# Patient Record
Sex: Male | Born: 1965 | Race: White | Hispanic: No | Marital: Married | Smoking: Current some day smoker
Health system: Southern US, Community
[De-identification: ages and names within clinical notes are randomized; demographics above are authoritative.]

---

## 2010-06-09 ENCOUNTER — Ambulatory Visit: Payer: Self-pay | Admitting: Surgery

## 2010-08-23 ENCOUNTER — Ambulatory Visit: Payer: Self-pay | Admitting: Internal Medicine

## 2010-08-25 ENCOUNTER — Ambulatory Visit: Payer: Self-pay | Admitting: Internal Medicine

## 2010-09-23 ENCOUNTER — Ambulatory Visit: Payer: Self-pay | Admitting: Internal Medicine

## 2011-03-08 ENCOUNTER — Observation Stay: Payer: Self-pay | Admitting: Internal Medicine

## 2011-12-10 IMAGING — CT CT NECK-CHEST-ABD-PELV W/ CM
2 series · 10 of 14 positions shown, 11 images · non-contrast
Comparison: none

REASON FOR EXAM: STAGING LYMPHOMA
COMMENTS:

PROCEDURE:     DINA MARIA - DINA MARIA NECK CHEST ABD/PEL W  - September 01, 2010  [DATE]
RESULT:
HISTORY: Lymphoma.

[Series 2: soft tissue · axial · 0.79mm/px · z∈[+374,+639]mm · 4 of 89 slices shown (1 of 2)]
[im 18/89  soft-tissue]
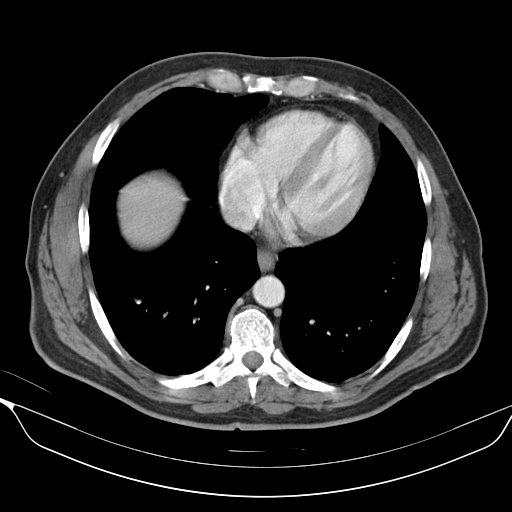
[im 36/89  soft-tissue]
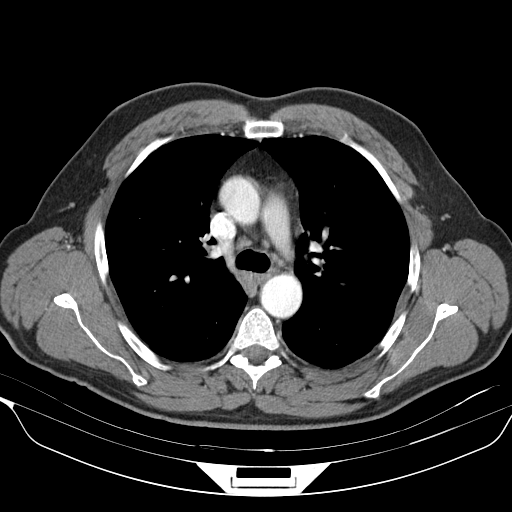
[im 53/89  soft-tissue]
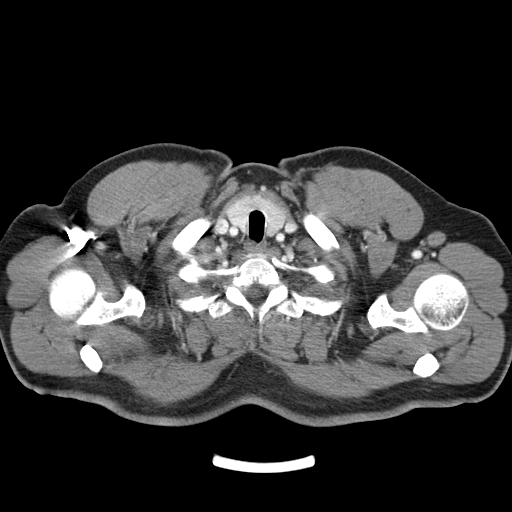
[im 71/89  soft-tissue]
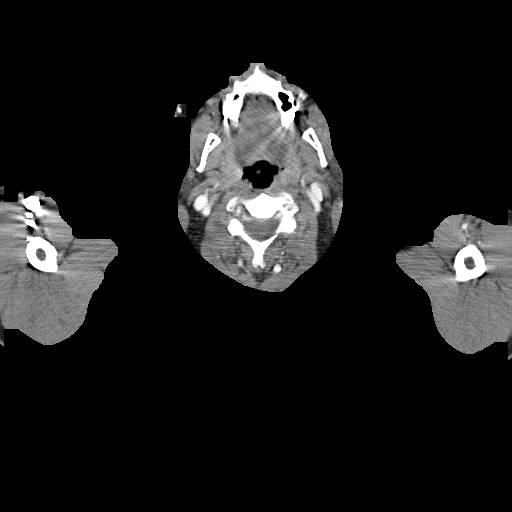

[Series 3: soft tissue · axial · 0.79mm/px · z∈[-34,+336]mm · 6 of 104 slices shown, 7 images (2 of 2)]
[im 15/104  soft-tissue]
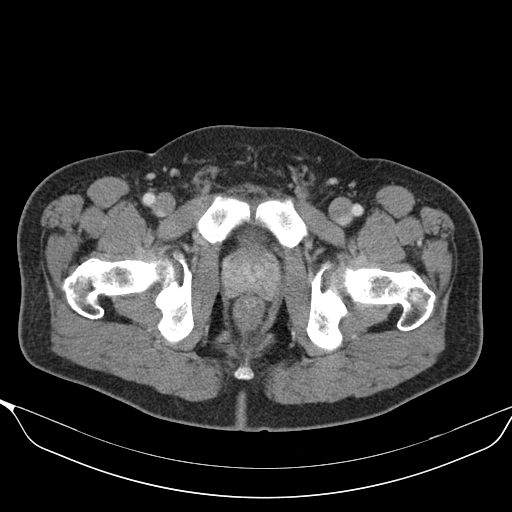
[im 15/104  bone]
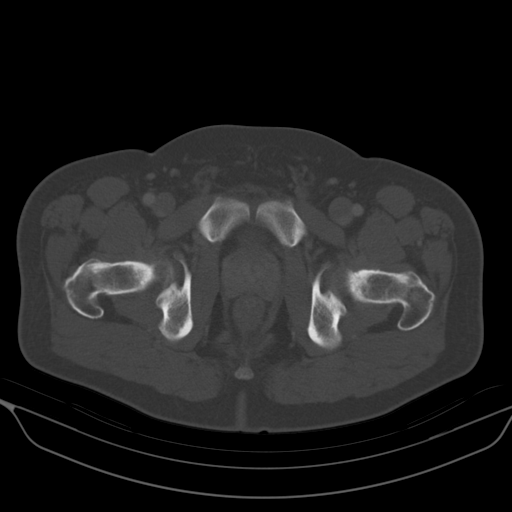
[im 30/104  soft-tissue]
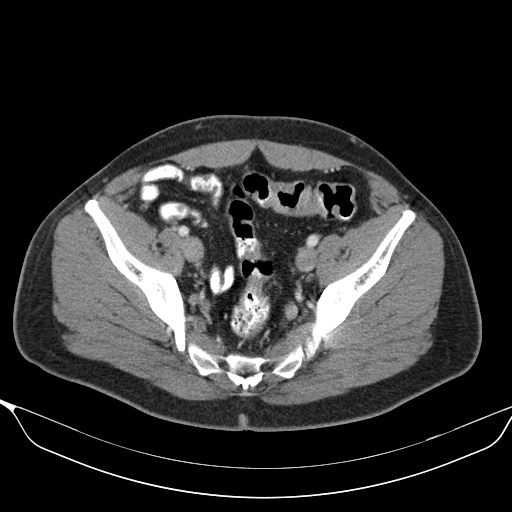
[im 45/104  soft-tissue]
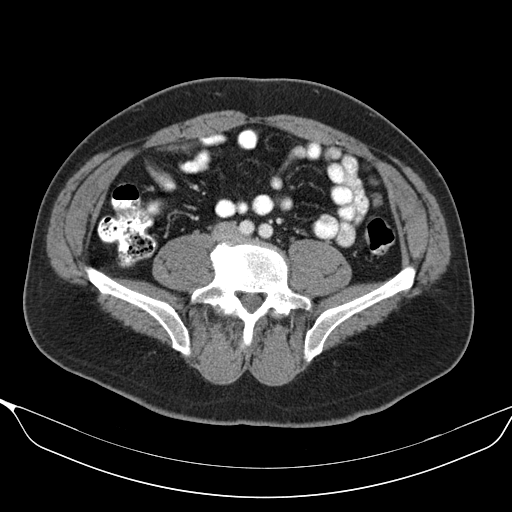
[im 59/104  soft-tissue]
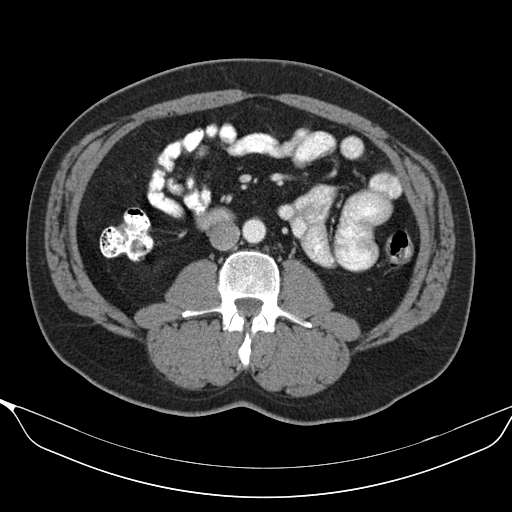
[im 74/104  soft-tissue]
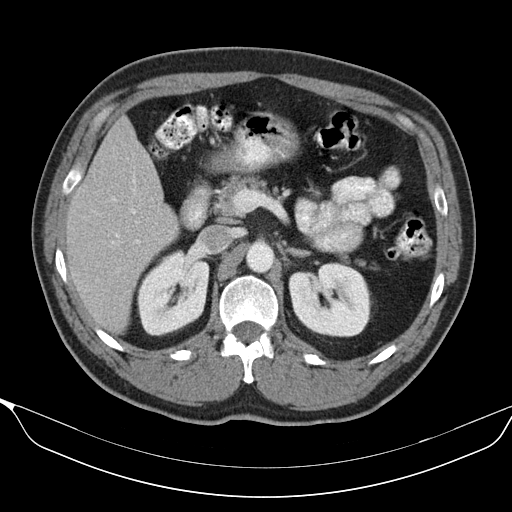
[im 89/104  soft-tissue]
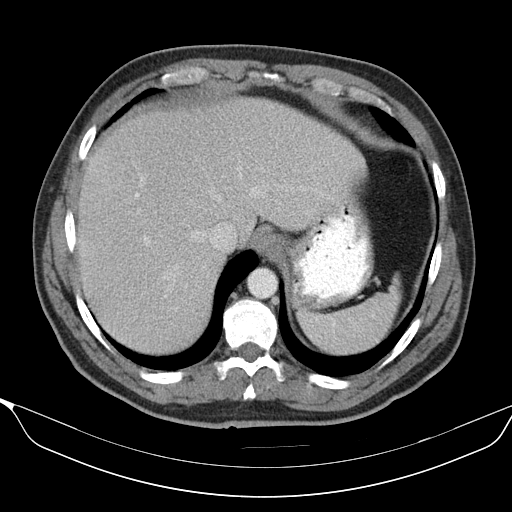

[10 of 14 positions shown; findings below may reference images not displayed]

PROCEDURE AND FINDINGS:  Standard CT of the neck, chest, abdomen and pelvis
is obtained. No prior exams are available for comparison. 115 ml of
Ysovue-ERR utilized for the study.

NECK CT SCAN:  CT of the neck reveals normal tongue base and salivary
glands. Shotty cervical lymph nodes are noted. No pathologic cervical lymph
nodes are noted using CT size criteria. The thyroid is normal. The skull
base is normal. Mild right perihilar infiltrate is present. The larynx is
normal. The thyroid is normal.
CONCLUSION: No significant abnormality noted on CT of neck.

CHEST CT SCAN:  CT of the chest reveals no significant mediastinal
adenopathy. Shotty mediastinal lymph nodes are noted. By CT size criteria,
these are nonspecific. No significant hilar adenopathy noted by CT size
criteria. Mild right perihilar infiltrate is present. The pulmonary arteries
are normal. The large airways are patent. The lungs are clear of
infiltrates.
CONCLUSION: CT of the chest is nonspecific.

ABDOMEN AND PELVIS CT SCAN:  CT of the abdomen and pelvis reveals normal
liver and spleen. The pancreas is normal. The adrenals are normal. The
kidneys are normal. There is no hydronephrosis. No retroperitoneal
pathologic adenopathy using CT criteria. Shotty retroperitoneal lymph nodes
are present. There is no bowel distention. There are no pelvic masses. The
bladder is nondistended. There is no inguinal adenopathy.

CT of abdomen and pelvis is nonspecific. No pathologic adenopathy noted
using CT size criteria.
IMPRESSION: Nonspecific exam. No significant adenopathy noted on this
CT of neck, chest, abdomen and pelvis.

## 2012-06-15 IMAGING — CR DG CHEST 1V
1 series · 1 of 1 positions shown · non-contrast
Comparison: none

REASON FOR EXAM: UPRIGHT; ABD/CP
COMMENTS:   May transport without cardiac monitor

PROCEDURE:     DXR - DXR CHEST 1 VIEWAP OR PA  - March 08, 2011  [DATE]
RESULT:     Comparison: None

[view not recorded]
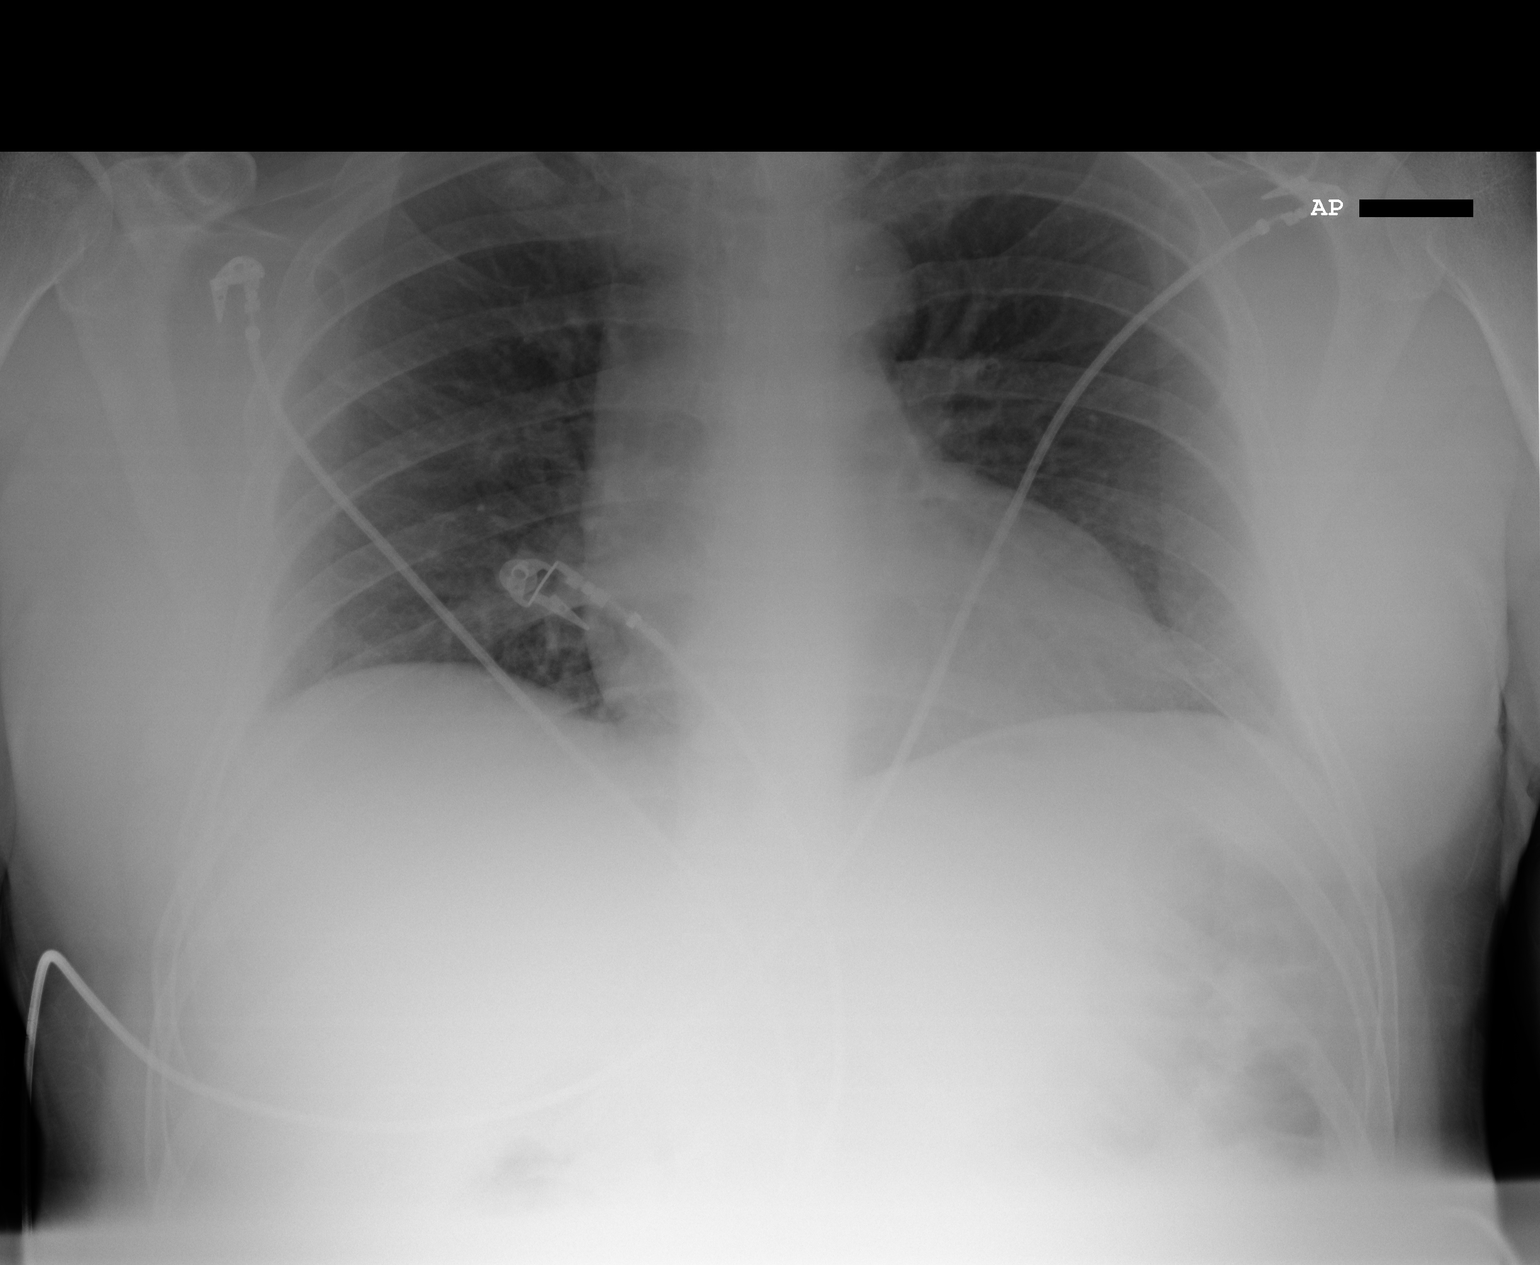

[1 of 1 positions shown; findings below may reference images not displayed]

FINDINGS: Single portable AP chest radiograph is provided.  There is no focal
parenchymal opacity, pleural effusion, or pneumothorax. Normal
cardiomediastinal silhouette. The osseous structures are unremarkable.
IMPRESSION: No acute disease of the chest.

## 2014-12-19 ENCOUNTER — Ambulatory Visit: Payer: Self-pay | Admitting: Emergency Medicine

## 2014-12-19 LAB — DOT URINE DIP
Blood: NEGATIVE
GLUCOSE, UR: NEGATIVE
Protein: NEGATIVE
Specific Gravity: 1.02 (ref 1.000–1.030)

## 2020-07-10 ENCOUNTER — Encounter: Payer: Self-pay | Admitting: Emergency Medicine

## 2020-07-10 ENCOUNTER — Other Ambulatory Visit: Payer: Self-pay

## 2020-07-10 ENCOUNTER — Ambulatory Visit
Admission: EM | Admit: 2020-07-10 | Discharge: 2020-07-10 | Disposition: A | Discharge status: Home / Self Care | Payer: 59 | Attending: Internal Medicine | Admitting: Internal Medicine

## 2020-07-10 DIAGNOSIS — G8929 Other chronic pain: Secondary | ICD-10-CM | POA: Diagnosis present

## 2020-07-10 DIAGNOSIS — M25561 Pain in right knee: Secondary | ICD-10-CM | POA: Insufficient documentation

## 2020-07-10 DIAGNOSIS — I10 Essential (primary) hypertension: Secondary | ICD-10-CM | POA: Diagnosis present

## 2020-07-10 DIAGNOSIS — M25461 Effusion, right knee: Secondary | ICD-10-CM | POA: Insufficient documentation

## 2020-07-10 LAB — COMPREHENSIVE METABOLIC PANEL
ALT: 16 U/L (ref 0–44)
AST: 21 U/L (ref 15–41)
Albumin: 4.2 g/dL (ref 3.5–5.0)
Alkaline Phosphatase: 71 U/L (ref 38–126)
Anion gap: 6 (ref 5–15)
BUN: 19 mg/dL (ref 6–20)
CO2: 23 mmol/L (ref 22–32)
Calcium: 9 mg/dL (ref 8.9–10.3)
Chloride: 107 mmol/L (ref 98–111)
Creatinine, Ser: 1.03 mg/dL (ref 0.61–1.24)
GFR calc Af Amer: >60 mL/min (ref >60)
GFR calc non Af Amer: >60 mL/min (ref >60)
Glucose, Bld: 90 mg/dL (ref 70–99)
Potassium: 4 mmol/L (ref 3.5–5.1)
Sodium: 136 mmol/L (ref 135–145)
Total Bilirubin: 0.6 mg/dL (ref 0.3–1.2)
Total Protein: 7.8 g/dL (ref 6.5–8.1)

## 2020-07-10 MED ORDER — LOSARTAN POTASSIUM 25 MG PO TABS: 25.0000 mg | ORAL_TABLET | Freq: Every day | ORAL | 1 refills | Status: DC

## 2020-07-10 MED ORDER — IBUPROFEN 800 MG PO TABS: 800.0000 mg | ORAL_TABLET | Freq: Three times a day (TID) | ORAL | 0 refills | Status: DC | PRN

## 2020-07-10 NOTE — ED Triage Notes (Signed)
Patient c/o right knee pain after he twisted it 1 1/2 years ago. He states he keeps re-injuring his knee and now has swelling and pain.

## 2020-07-10 NOTE — ED Provider Notes (Addendum)
[facility]    CSN: [id number] Arrival date & time: 07/10/20  1558      History   Chief Complaint Chief Complaint  Patient presents with  . Knee Pain  . Joint Swelling    HPI Randy May is a 54 y.o. male with a history of chronic right knee pain with swelling comes to urgent care for right knee swelling.  Patient is requesting pain medications because he anticipates driving to [place] for work.  Right knee swelling and pain has been a recurrent issue over the past several years.  He has had multiple injuries to the right knee.  He uses knee brace on a regular basis and that seems to help the swelling.   Patient also has concerns about his blood pressure.  He has a family history of high blood pressure and according to the patient his blood pressure checks have all been high over the past several months.  He has not been started on any antihypertensive medication.  No chest pain or chest pressure.  No abdominal pain. HPI  History reviewed. No pertinent past medical history.  There are no problems to display for this patient.   History reviewed. No pertinent surgical history.     Home Medications    Prior to Admission medications   Medication Sig Start Date End Date Taking? Authorizing Provider  ibuprofen (ADVIL) 800 MG tablet Take 1 tablet (800 mg total) by mouth every 8 (eight) hours as needed for moderate pain. 07/10/20   Merrilee Jansky, MD  losartan (COZAAR) 25 MG tablet Take 1 tablet (25 mg total) by mouth daily. 07/10/20   Kasiah Manka, Britta Mccreedy, MD    Family History History reviewed. No pertinent family history.  Social History Social History   Tobacco Use  . Smoking status: Current Some Day Smoker  . Smokeless tobacco: Never Used  Substance Use Topics  . Alcohol use: Yes  . Drug use: Never     Allergies   Patient has no known allergies.   Review of Systems Review of Systems  Constitutional: Negative.   Respiratory: Negative.    Gastrointestinal: Negative.   Genitourinary: Negative.   Musculoskeletal: Positive for arthralgias and joint swelling. Negative for back pain, myalgias, neck pain and neck stiffness.  Neurological: Negative.      Physical Exam Triage Vital Signs ED Triage Vitals  Enc Vitals Group     BP 07/10/20 1629 (!) 181/100     Pulse Rate 07/10/20 1629 100     Resp 07/10/20 1629 18     Temp 07/10/20 1629 98 F (36.7 C)     Temp Source 07/10/20 1629 Oral     SpO2 07/10/20 1629 100 %     Weight 07/10/20 1631 215 lb (97.5 kg)     Height 07/10/20 1631 6\' 1"  (1.854 m)     Head Circumference --      Peak Flow --      Pain Score 07/10/20 1631 9     Pain Loc --      Pain Edu? --      Excl. in GC? --    No data found.  Updated Vital Signs BP (!) 181/100 (BP Location: Right Arm)   Pulse 100   Temp 98 F (36.7 C) (Oral)   Resp 18   Ht 6\' 1"  (1.854 m)   Wt 97.5 kg   SpO2 100%   BMI 28.37 kg/m   Visual Acuity Right Eye Distance:   Left  Eye Distance:   Bilateral Distance:    Right Eye Near:   Left Eye Near:    Bilateral Near:     Physical Exam Vitals and nursing note reviewed.  Cardiovascular:     Rate and Rhythm: Normal rate.  Pulmonary:     Effort: Pulmonary effort is normal.     Breath sounds: Normal breath sounds.  Abdominal:     General: Bowel sounds are normal. There is no distension.     Palpations: Abdomen is soft.     Tenderness: There is no abdominal tenderness.     Hernia: No hernia is present.  Musculoskeletal:        General: Swelling present. No tenderness, deformity or signs of injury. Normal range of motion.     Comments: No bruising on the knee.  Skin:    General: Skin is warm.     Capillary Refill: Capillary refill takes less than 2 seconds.  Neurological:     General: No focal deficit present.     Mental Status: He is alert and oriented to person, place, and time.      UC Treatments / Results  Labs (all labs ordered are listed, but only abnormal  results are displayed) Labs Reviewed  COMPREHENSIVE METABOLIC PANEL    EKG   Radiology No results found.  Procedures Procedures (including critical care time)  Medications Ordered in UC Medications - No data to display  Initial Impression / Assessment and Plan / UC Course  I have reviewed the triage vital signs and the nursing notes.  Pertinent labs & imaging results that were available during my care of the patient were reviewed by me and considered in my medical decision making (see chart for details).     1. Right knee effusion, chronic: Ibuprofen 600 mg q6h as needed for pain Continue the knee brace use  Please make arrangements to follow up with orthopedic surgery.  I advised the patient establish primary care physicians services. He is willing to do so in due course. Return precautions given.  2.  Hypertension likely essential: BMP Losartan 25mg  orally daily Patient is advised to establish with a PCP. Final Clinical Impressions(s) / UC Diagnoses   Final diagnoses:  Effusion of right knee  Chronic pain of right knee  Essential hypertension     Discharge Instructions     Call the Orthopedic practice or go to the orthopedic surgery urgent care    ED Prescriptions    Medication Sig Dispense Auth. Provider   ibuprofen (ADVIL) 800 MG tablet Take 1 tablet (800 mg total) by mouth every 8 (eight) hours as needed for moderate pain. 30 tablet Jamespaul Secrist, , MD   losartan (COZAAR) 25 MG tablet Take 1 tablet (25 mg total) by mouth daily. 30 tablet Carrick Rijos, Britta Mccreedy, MD     I have reviewed the PDMP during this encounter.   Britta Mccreedy, MD 07/11/20 1105    07/13/20, MD 07/11/20 [phone]

## 2020-07-10 NOTE — Discharge Instructions (Addendum)
Call the Orthopedic practice or go to the orthopedic surgery urgent care

## 2021-04-11 ENCOUNTER — Other Ambulatory Visit: Payer: Self-pay

## 2021-04-11 ENCOUNTER — Emergency Department: Payer: PRIVATE HEALTH INSURANCE

## 2021-04-11 ENCOUNTER — Emergency Department
Admission: EM | Admit: 2021-04-11 | Discharge: 2021-04-13 | Disposition: A | Discharge status: Home / Self Care | Payer: Self-pay | Attending: Emergency Medicine | Admitting: Emergency Medicine

## 2021-04-11 DIAGNOSIS — F29 Unspecified psychosis not due to a substance or known physiological condition: Secondary | ICD-10-CM | POA: Insufficient documentation

## 2021-04-11 DIAGNOSIS — F23 Brief psychotic disorder: Secondary | ICD-10-CM | POA: Diagnosis not present

## 2021-04-11 DIAGNOSIS — F3112 Bipolar disorder, current episode manic without psychotic features, moderate: Secondary | ICD-10-CM | POA: Insufficient documentation

## 2021-04-11 DIAGNOSIS — Z20822 Contact with and (suspected) exposure to covid-19: Secondary | ICD-10-CM | POA: Insufficient documentation

## 2021-04-11 DIAGNOSIS — F17200 Nicotine dependence, unspecified, uncomplicated: Secondary | ICD-10-CM | POA: Insufficient documentation

## 2021-04-11 DIAGNOSIS — F3110 Bipolar disorder, current episode manic without psychotic features, unspecified: Secondary | ICD-10-CM

## 2021-04-11 DIAGNOSIS — R45851 Suicidal ideations: Secondary | ICD-10-CM | POA: Insufficient documentation

## 2021-04-11 LAB — CBC
HCT: 37 % — ABNORMAL LOW (ref 39.0–52.0)
Hemoglobin: 13 g/dL (ref 13.0–17.0)
MCH: 33.8 pg (ref 26.0–34.0)
MCHC: 35.1 g/dL (ref 30.0–36.0)
MCV: 96.1 fL (ref 80.0–100.0)
Platelets: 330 10*3/uL (ref 150–400)
RBC: 3.85 MIL/uL — ABNORMAL LOW (ref 4.22–5.81)
RDW: 13 % (ref 11.5–15.5)
WBC: 7.4 10*3/uL (ref 4.0–10.5)
nRBC: 0 % (ref 0.0–0.2)

## 2021-04-11 LAB — URINE DRUG SCREEN, QUALITATIVE (ARMC ONLY)
Amphetamines, Ur Screen: NOT DETECTED
Barbiturates, Ur Screen: NOT DETECTED
Benzodiazepine, Ur Scrn: NOT DETECTED
Cannabinoid 50 Ng, Ur ~~LOC~~: NOT DETECTED
Cocaine Metabolite,Ur ~~LOC~~: NOT DETECTED
MDMA (Ecstasy)Ur Screen: NOT DETECTED
Methadone Scn, Ur: NOT DETECTED
Opiate, Ur Screen: NOT DETECTED
Phencyclidine (PCP) Ur S: NOT DETECTED
Tricyclic, Ur Screen: NOT DETECTED

## 2021-04-11 LAB — COMPREHENSIVE METABOLIC PANEL
ALT: 14 U/L (ref 0–44)
AST: 20 U/L (ref 15–41)
Albumin: 4.2 g/dL (ref 3.5–5.0)
Alkaline Phosphatase: 73 U/L (ref 38–126)
Anion gap: 10 (ref 5–15)
BUN: 22 mg/dL — ABNORMAL HIGH (ref 6–20)
CO2: 20 mmol/L — ABNORMAL LOW (ref 22–32)
Calcium: 9.3 mg/dL (ref 8.9–10.3)
Chloride: 107 mmol/L (ref 98–111)
Creatinine, Ser: 0.95 mg/dL (ref 0.61–1.24)
GFR, Estimated: >60 mL/min (ref >60)
Glucose, Bld: 93 mg/dL (ref 70–99)
Potassium: 4 mmol/L (ref 3.5–5.1)
Sodium: 137 mmol/L (ref 135–145)
Total Bilirubin: 1.1 mg/dL (ref 0.3–1.2)
Total Protein: 7.4 g/dL (ref 6.5–8.1)

## 2021-04-11 LAB — SALICYLATE LEVEL: Salicylate Lvl: <7 mg/dL — ABNORMAL LOW (ref 7.0–30.0)

## 2021-04-11 LAB — RESP PANEL BY RT-PCR (FLU A&B, COVID) ARPGX2
Influenza A by PCR: NEGATIVE
Influenza B by PCR: NEGATIVE
SARS Coronavirus 2 by RT PCR: NEGATIVE

## 2021-04-11 LAB — ETHANOL: Alcohol, Ethyl (B): <10 mg/dL (ref <10)

## 2021-04-11 LAB — ACETAMINOPHEN LEVEL: Acetaminophen (Tylenol), Serum: <10 ug/mL — ABNORMAL LOW (ref 10–30)

## 2021-04-11 MED ORDER — LORAZEPAM 2 MG/ML IJ SOLN
INTRAMUSCULAR | Status: AC
  Filled 2021-04-11: qty 1

## 2021-04-11 MED ORDER — ZIPRASIDONE MESYLATE 20 MG IM SOLR: 20.0000 mg | Freq: Two times a day (BID) | INTRAMUSCULAR | Status: DC | PRN

## 2021-04-11 MED ORDER — LORAZEPAM 2 MG/ML IJ SOLN
2.0000 mg | Freq: Once | INTRAMUSCULAR | Status: AC
  Administered 2021-04-11: 2 mg via INTRAMUSCULAR

## 2021-04-11 MED ORDER — DIPHENHYDRAMINE HCL 50 MG/ML IJ SOLN
50.0000 mg | Freq: Once | INTRAMUSCULAR | Status: AC
  Administered 2021-04-11: 50 mg via INTRAMUSCULAR
  Filled 2021-04-11: qty 1

## 2021-04-11 MED ORDER — HALOPERIDOL LACTATE 5 MG/ML IJ SOLN
5.0000 mg | Freq: Once | INTRAMUSCULAR | Status: AC
  Administered 2021-04-11: 5 mg via INTRAMUSCULAR
  Filled 2021-04-11: qty 1

## 2021-04-11 NOTE — ED Notes (Signed)
Pt to CT
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
## 2021-04-11 NOTE — ED Notes (Signed)
Pt belongings:  Sunglasses 1 gray shirt 1 pair of dark gray sweatpants  1 pair of black socks  1 pair of black sneakers    Pt has 2 belongings bags.

## 2021-04-11 NOTE — ED Notes (Signed)
Standing at doorway demanding that staff call [facility]. States that he is not a [place] Citizen and his name is not [name]. States he wife is "[place]" from [employer]. Pt demanding belongings.

## 2021-04-11 NOTE — ED Notes (Signed)
Pt given dinner tray.

## 2021-04-11 NOTE — ED Provider Notes (Signed)
Patient is becoming more agitated, ordered 2 mg of IM Ativan to help calm him   Jene Every, MD 04/11/21 1447

## 2021-04-11 NOTE — ED Notes (Signed)
Dr.Clapacs at bedside  

## 2021-04-11 NOTE — ED Notes (Signed)
Pt received lunch 

## 2021-04-11 NOTE — BH Assessment (Signed)
Referral information for Psychiatric Hospitalization faxed to;   [facility] [phone]),   . [facility] [phone]- [phone]),  . [facility] ([phone]),  . [facility] [phone], [phone], [phone] or [phone]),   . [place] [phone] or [phone]),  . [facility] [phone]),   . [facility] [phone]- [phone]   . [place] [phone] or [phone]),   . [facility] [phone]),  . [facility] [phone]).

## 2021-04-11 NOTE — BH Assessment (Signed)
Writer was unable to complete TTS consult due to patient receiving medications and he is unable to participate in the interview.

## 2021-04-11 NOTE — ED Notes (Signed)
Pt standing at doorway yelling, stating that he is leaving. Pt asked to sit down on bed so I could administer medications. Sits down and takes IM medications without difficulties.
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
## 2021-04-12 DIAGNOSIS — F3110 Bipolar disorder, current episode manic without psychotic features, unspecified: Secondary | ICD-10-CM

## 2021-04-12 MED ORDER — DROPERIDOL 2.5 MG/ML IJ SOLN
10.0000 mg | Freq: Once | INTRAMUSCULAR | Status: AC
  Administered 2021-04-12: 10 mg via INTRAMUSCULAR

## 2021-04-12 MED ORDER — OLANZAPINE 10 MG PO TABS
10.0000 mg | ORAL_TABLET | Freq: Two times a day (BID) | ORAL | Status: DC
  Administered 2021-04-13: 10 mg via ORAL
  Filled 2021-04-12: qty 1
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
## 2021-04-12 NOTE — ED Provider Notes (Signed)
Emergency Medicine Observation Re-evaluation Note  Randy May is a 55 y.o. male, seen on rounds today.  Pt initially presented to the ED for complaints of Aggressive Behavior Currently, the patient is resting comfortably.  Physical Exam  BP (!) 134/92 (BP Location: Left Arm)   Pulse 89   Temp 98.4 F (36.9 C) (Oral)   Resp 18   Ht 6\' 1"  (1.854 m)   Wt 81.6 kg   SpO2 98%   BMI 23.75 kg/m  Physical Exam Gen: No acute distress  Resp: Normal rise and fall of chest Neuro: Moving all four extremities Psych: Resting currently, calm and cooperative when awake    ED Course / MDM  EKG:   I have reviewed the labs performed to date as well as medications administered while in observation.  Recent changes in the last 24 hours include no acute events overnight.  Plan  Current plan is for inpatient psychiatric treatment. Patient is under full IVC at this time.   Randy May, , DO 04/12/21 [phone]

## 2021-04-12 NOTE — ED Notes (Signed)
Pt up to bathroom.

## 2021-04-12 NOTE — ED Notes (Signed)
Pt given phone. Pt able to recite number to this RN to dial. Making more sense at this time.
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
## 2021-04-12 NOTE — ED Notes (Signed)
Pt out of room asking for a blanket and for his phone once again. This tech and Tax inspector spoke with pt about his IVC paperwork. He also asked "when am I getting out of here? I have to go to work. If you don't show up for work its ok but if I don't show up the place closes." Musician gave pt a phone to call work and explain the situation.
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
## 2021-04-12 NOTE — ED Notes (Signed)
IVC pending placement 
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
## 2021-04-12 NOTE — ED Notes (Signed)
Pt given lunch tray.

## 2021-04-12 NOTE — ED Notes (Signed)
Pt calmer now. Up to ask officer question then goes back to bed.

## 2021-04-12 NOTE — Consult Note (Signed)
[facility] Face-to-Face Psychiatry Consult   Reason for Consult: Consult for 55 year old May with no known past psychiatric history brought to the emergency room with acute psychosis Referring Physician: Fuller Plan Patient Identification: Randy May MRN:  [id number] Principal Diagnosis: Bipolar affective disorder, current episode manic without psychotic symptoms (HCC) Diagnosis:  Principal Problem:   Bipolar affective disorder, current episode manic without psychotic symptoms (HCC) Active Problems:   Acute psychosis (HCC)  Total Time spent with patient: 30 minutes  Subjective:   Randy May is a 55 y.o. male patient admitted with "I'm very frustrated.  I'm missing work and haven't talked to my wife".  55 yo male who was given agitation medication prior to assessment.  He was resting quietly and sat up for the assessment.  He expresses frustration and continues to be manic, improved from admission.  The client stated he was treated "with Lithium in prison" but does not need it and stopped taking it a week ago when he was released.  He was actually in jail and maybe some prison time.  Continues to be tangential at times and upset he was given medications, "That crazy, I'm not crazy."  He lives with his parents and states they bother him frequently, per the notes, he was aggressive towards them.  Continues to meet inpatient criteria for admission.  5/20 admission: Patient seen and chart reviewed.  55 year old May brought in under IVC filed by family.  The IVC paperwork says that the patient has been "very emotional" for the last day and has been agitated and aggressive towards his family at home.  Patient presents as grossly psychotic in a manic fashion.  He has been hyperverbal since coming into the emergency room.  Can hardly sit down for more than a few seconds.  Talks loudly and with pressured speech and is grandiose and has flight of ideas.  Tells me that he is not really Randy May and tells a  long and delusional story about how he is really a Randy May who was sent to replace Randy May after Randy May was killed in a military accident.  Boasts about multiple degrees that he has.  The information I could get out of him that seemed realistic was him telling me that he had not slept for at least for 5 days.  He denies that he has used any drugs whatsoever and we do not have a drug screen yet.  Specifically denies any stimulants.  Says he had 2 beers yesterday but normally does not drink much.  Patient has been slightly physically aggressive putting his hands on people but not swinging or attempting violence.  He appears to be lucid insofar as not delirious and does not appear to be in any acute physical distress.  He is stable on his feet.  Eyesight seems normal.  Patient denies hallucinations.  Denies being on any medication.  Past Psychiatric History: Patient says he was seen by a psychiatrist when he was in jail 2 weeks ago but does not disclose anything about the situation.  Other than that he says he is never seen a mental health provider at all.  Denies hospitalizations.  Denies medication trials.  Denies suicidal or homicidal behavior.  Denies any alcohol or drug abuse history.  Old chart does not have any information that suggests any psychiatric history.  Risk to Self:  yes Risk to Others:  yes Prior Inpatient Therapy:  treated in jail and prison Prior Outpatient Therapy:  none currently  Past Medical  History: No past medical history on file. No past surgical history on file. Family History: No family history on file. Family Psychiatric  History: None known Social History:  Social History   Substance and Sexual Activity  Alcohol Use Yes     Social History   Substance and Sexual Activity  Drug Use Never    Social History   Socioeconomic History  . Marital status: Married    Spouse name: Not on file  . Number of children: Not on file  . Years of education: Not on file   . Highest education level: Not on file  Occupational History  . Not on file  Tobacco Use  . Smoking status: Current Some Day Smoker  . Smokeless tobacco: Never Used  Substance and Sexual Activity  . Alcohol use: Yes  . Drug use: Never  . Sexual activity: Not on file  Other Topics Concern  . Not on file  Social History Narrative  . Not on file   Social Determinants of Health   Financial Resource Strain: Not on file  Food Insecurity: Not on file  Transportation Needs: Not on file  Physical Activity: Not on file  Stress: Not on file  Social Connections: Not on file   Additional Social History:    Allergies:  No Known Allergies  Labs:  Results for orders placed or performed during the hospital encounter of 04/11/21 (from the past 48 hour(s))  Comprehensive metabolic panel     Status: Abnormal   Collection Time: 04/11/21 12:47 PM  Result Value Ref Range   Sodium 137 135 - 145 mmol/L   Potassium 4.0 3.5 - 5.1 mmol/L   Chloride 107 98 - 111 mmol/L   CO2 20 (L) 22 - 32 mmol/L   Glucose, Bld 93 70 - 99 mg/dL    Comment: Glucose reference range applies only to samples taken after fasting for at least 8 hours.   BUN 22 (H) 6 - 20 mg/dL   Creatinine, Ser 0.53 0.61 - 1.24 mg/dL   Calcium 9.3 8.9 - 97.6 mg/dL   Total Protein 7.4 6.5 - 8.1 g/dL   Albumin 4.2 3.5 - 5.0 g/dL   AST 20 15 - 41 U/L   ALT 14 0 - 44 U/L   Alkaline Phosphatase 73 38 - 126 U/L   Total Bilirubin 1.1 0.3 - 1.2 mg/dL   GFR, Estimated >73 >41 mL/min    Comment: (NOTE) Calculated using the CKD-EPI Creatinine Equation (2021)    Anion gap 10 5 - 15    Comment: Performed at [facility], [address] [place], [place] [address]  Ethanol     Status: None   Collection Time: 04/11/21 12:47 PM  Result Value Ref Range   Alcohol, Ethyl (B) <10 <10 mg/dL    Comment: (NOTE) Lowest detectable limit for serum alcohol is 10 mg/dL.  For medical purposes only. Performed at [facility], [address] [place], [place] [address]   Salicylate level     Status: Abnormal   Collection Time: 04/11/21 12:47 PM  Result Value Ref Range   Salicylate Lvl <7.0 (L) 7.0 - 30.0 mg/dL    Comment: Performed at [facility], [address] [place], [place] [address]  Acetaminophen level     Status: Abnormal   Collection Time: 04/11/21 12:47 PM  Result Value Ref Range   Acetaminophen (Tylenol), Serum <10 (L) 10 - 30 ug/mL    Comment: (NOTE) Therapeutic concentrations vary significantly. A range  of 10-30 ug/mL  may be an effective concentration for many patients. However, some  are best treated at concentrations outside of this range. Acetaminophen concentrations >150 ug/mL at 4 hours after ingestion  and >50 ug/mL at 12 hours after ingestion are often associated with  toxic reactions.  Performed at [facility], [address] [place], [place] [address]   cbc     Status: Abnormal   Collection Time: 04/11/21 12:47 PM  Result Value Ref Range   WBC 7.4 4.0 - 10.5 K/uL   RBC 3.85 (L) 4.22 - 5.81 MIL/uL   Hemoglobin 13.0 13.0 - 17.0 g/dL   HCT 60.4 (L) 54.0 - 98.1 %   MCV 96.1 80.0 - 100.0 fL   MCH 33.8 26.0 - 34.0 pg   MCHC 35.1 30.0 - 36.0 g/dL   RDW 19.1 47.8 - 29.5 %   Platelets 330 150 - 400 K/uL   nRBC 0.0 0.0 - 0.2 %    Comment: Performed at [facility], [address] [place], [place] [address]  Urine Drug Screen, Qualitative     Status: None   Collection Time: 04/11/21 12:47 PM  Result Value Ref Range   Tricyclic, Ur Screen NONE DETECTED NONE DETECTED   Amphetamines, Ur Screen NONE DETECTED NONE DETECTED   MDMA (Ecstasy)Ur Screen NONE DETECTED NONE DETECTED   Cocaine Metabolite,Ur [place] NONE DETECTED NONE DETECTED   Opiate, Ur Screen NONE DETECTED NONE DETECTED   Phencyclidine (PCP) Ur S NONE DETECTED NONE DETECTED   Cannabinoid 50 Ng, Ur [place] NONE DETECTED NONE DETECTED   Barbiturates, Ur Screen NONE DETECTED NONE DETECTED    Benzodiazepine, Ur Scrn NONE DETECTED NONE DETECTED   Methadone Scn, Ur NONE DETECTED NONE DETECTED    Comment: (NOTE) Tricyclics + metabolites, urine    Cutoff 1000 ng/mL Amphetamines + metabolites, urine  Cutoff 1000 ng/mL MDMA (Ecstasy), urine              Cutoff 500 ng/mL Cocaine Metabolite, urine          Cutoff 300 ng/mL Opiate + metabolites, urine        Cutoff 300 ng/mL Phencyclidine (PCP), urine         Cutoff 25 ng/mL Cannabinoid, urine                 Cutoff 50 ng/mL Barbiturates + metabolites, urine  Cutoff 200 ng/mL Benzodiazepine, urine              Cutoff 200 ng/mL Methadone, urine                   Cutoff 300 ng/mL  The urine drug screen provides only a preliminary, unconfirmed analytical test result and should not be used for non-medical purposes. Clinical consideration and professional judgment should be applied to any positive drug screen result due to possible interfering substances. A more specific alternate chemical method must be used in order to obtain a confirmed analytical result. Gas chromatography / mass spectrometry (GC/MS) is the preferred confirm atory method. Performed at [facility], [address] [place], [place] [address]   Resp Panel by RT-PCR (Flu A&B, Covid) Nasopharyngeal Swab     Status: None   Collection Time: 04/11/21  3:43 PM   Specimen: Nasopharyngeal Swab; Nasopharyngeal(NP) swabs in vial transport medium  Result Value Ref Range   SARS Coronavirus 2 by RT PCR NEGATIVE NEGATIVE    Comment: (NOTE) SARS-CoV-2 target nucleic acids are NOT DETECTED.  The SARS-CoV-2  RNA is generally detectable in upper respiratory specimens during the acute phase of infection. The lowest concentration of SARS-CoV-2 viral copies this assay can detect is 138 copies/mL. A negative result does not preclude SARS-Cov-2 infection and should not be used as the sole basis for treatment or other patient management decisions. A negative result may occur  with  improper specimen collection/handling, submission of specimen other than nasopharyngeal swab, presence of viral mutation(s) within the areas targeted by this assay, and inadequate number of viral copies(<138 copies/mL). A negative result must be combined with clinical observations, patient history, and epidemiological information. The expected result is Negative.  Fact Sheet for Patients:  BloggerCourse.comhttps://www.fda.gov/media/152166/download  Fact Sheet for Healthcare Providers:  SeriousBroker.ithttps://www.fda.gov/media/152162/download  This test is no t yet approved or cleared by the [place] FDA and  has been authorized for detection and/or diagnosis of SARS-CoV-2 by FDA under an Emergency Use Authorization (EUA). This EUA will remain  in effect (meaning this test can be used) for the duration of the COVID-19 declaration under Section 564(b)(1) of the Act, 21 U.S.C.section 360bbb-3(b)(1), unless the authorization is terminated  or revoked sooner.       Influenza A by PCR NEGATIVE NEGATIVE   Influenza B by PCR NEGATIVE NEGATIVE    Comment: (NOTE) The Xpert Xpress SARS-CoV-2/FLU/RSV plus assay is intended as an aid in the diagnosis of influenza from Nasopharyngeal swab specimens and should not be used as a sole basis for treatment. Nasal washings and aspirates are unacceptable for Xpert Xpress SARS-CoV-2/FLU/RSV testing.  Fact Sheet for Patients: BloggerCourse.comhttps://www.fda.gov/media/152166/download  Fact Sheet for Healthcare Providers: SeriousBroker.ithttps://www.fda.gov/media/152162/download  This test is not yet approved or cleared by the [place] FDA and has been authorized for detection and/or diagnosis of SARS-CoV-2 by FDA under an Emergency Use Authorization (EUA). This EUA will remain in effect (meaning this test can be used) for the duration of the COVID-19 declaration under Section 564(b)(1) of the Act, 21 U.S.C. section 360bbb-3(b)(1), unless the authorization is terminated or revoked.  Performed  at [facility], [address] [place], [place] [address]     Current Facility-Administered Medications  Medication Dose Route Frequency Provider Last Rate Last Admin  . OLANZapine (ZYPREXA) tablet 10 mg  10 mg Oral BID Charm RingsLord, Nicoli Nardozzi Y, NP      . ziprasidone (GEODON) injection 20 mg  20 mg Intramuscular Q12H PRN Clapacs, Jackquline DenmarkJohn T, MD       No current outpatient medications on file.    Musculoskeletal: Strength & Muscle Tone: within normal limits Gait & Station: normal Patient leans: N/A  Psychiatric Specialty Exam: Physical Exam Vitals and nursing note reviewed.  Constitutional:      Appearance: Normal appearance.  HENT:     Head: Normocephalic and atraumatic.     Mouth/Throat:     Pharynx: Oropharynx is clear.  Eyes:     Pupils: Pupils are equal, round, and reactive to light.  Cardiovascular:     Rate and Rhythm: Normal rate and regular rhythm.  Pulmonary:     Effort: Pulmonary effort is normal.     Breath sounds: Normal breath sounds.  Abdominal:     General: Abdomen is flat.     Palpations: Abdomen is soft.  Musculoskeletal:        General: Normal range of motion.  Skin:    General: Skin is warm and dry.  Neurological:     General: No focal deficit present.     Mental Status: He is alert. Mental status is at  baseline.  Psychiatric:        Attention and Perception: He is inattentive.        Mood and Affect: Mood is elated. Affect is labile and inappropriate.        Speech: Speech is tangential.        Behavior: Behavior is agitated.        Thought Content: Thought content is delusional. Thought content does not include homicidal or suicidal ideation.        Cognition and Memory: Cognition is impaired. Memory is impaired.        Judgment: Judgment is inappropriate.     Review of Systems  Constitutional: Negative.   HENT: Negative.   Eyes: Negative.   Respiratory: Negative.   Cardiovascular: Negative.   Gastrointestinal: Negative.    Musculoskeletal: Negative.   Skin: Negative.   Neurological: Negative.   Psychiatric/Behavioral: Positive for memory loss. Negative for depression, hallucinations, substance abuse and suicidal ideas. The patient is nervous/anxious and has insomnia.     Blood pressure 133/85, pulse 75, temperature 98.4 F (36.9 C), temperature source Oral, resp. rate 16, height 6\' 1"  (1.854 m), weight 81.6 kg, SpO2 99 %.Body mass index is 23.75 kg/m.  General Appearance: Casual  Eye Contact:  Fair  Speech:  Normal Rate  Volume:  Increased  Mood:  Anxious and Irritable  Affect:  Congruent  Thought Process:  Descriptions of Associations: Tangential  Orientation:  Other:  person and place  Thought Content:  Delusions and Tangential  Suicidal Thoughts:  No  Homicidal Thoughts:  No  Memory:  Immediate;   Poor Recent;   Poor Remote;   Fair  Judgement:  Impaired  Insight:  Lacking  Psychomotor Activity:  Increased  Concentration:  Concentration: Poor and Attention Span: Poor  Recall:  of Knowledge:  Fair  Language:  Good  Akathisia:  No  Handed:  Right  AIMS (if indicated):     Assets:  Housing Leisure Time Physical Health Resilience Social Support  ADL's:  Intact  Cognition:  Impaired,  Mild  Sleep:      Physical Exam: Physical Exam Vitals and nursing note reviewed.  Constitutional:      Appearance: Normal appearance.  HENT:     Head: Normocephalic and atraumatic.     Mouth/Throat:     Pharynx: Oropharynx is clear.  Eyes:     Pupils: Pupils are equal, round, and reactive to light.  Cardiovascular:     Rate and Rhythm: Normal rate and regular rhythm.  Pulmonary:     Effort: Pulmonary effort is normal.     Breath sounds: Normal breath sounds.  Abdominal:     General: Abdomen is flat.     Palpations: Abdomen is soft.  Musculoskeletal:        General: Normal range of motion.  Skin:    General: Skin is warm and dry.  Neurological:     General: No focal deficit  present.     Mental Status: He is alert. Mental status is at baseline.  Psychiatric:        Attention and Perception: He is inattentive.        Mood and Affect: Mood is elated. Affect is labile and inappropriate.        Speech: Speech is tangential.        Behavior: Behavior is agitated.        Thought Content: Thought content is delusional. Thought content does not include homicidal or suicidal ideation.  Cognition and Memory: Cognition is impaired. Memory is impaired.        Judgment: Judgment is inappropriate.    Review of Systems  Constitutional: Negative.   HENT: Negative.   Eyes: Negative.   Respiratory: Negative.   Cardiovascular: Negative.   Gastrointestinal: Negative.   Musculoskeletal: Negative.   Skin: Negative.   Neurological: Negative.   Psychiatric/Behavioral: Positive for memory loss. Negative for depression, hallucinations, substance abuse and suicidal ideas. The patient is nervous/anxious and has insomnia.    Blood pressure 133/85, pulse 75, temperature 98.4 F (36.9 C), temperature source Oral, resp. rate 16, height 6\' 1"  (1.854 m), weight 81.6 kg, SpO2 99 %. Body mass index is 23.75 kg/m.  Treatment Plan Summary: Medication management and Plan This is a 55 year old May who presents with what seems like only a couple of days of acute psychosis maybe a little longer than that if he was having it when he was in jail as well.  Not sleeping.  Pressured speech and grandiosity flight of ideas.  Presentation is extremely typical for manic psychosis but there is no known history of bipolar disorder.  Could also be drug induced.  Drug screen has not been obtained because he has not cooperated with a urine sample yet.  Patient is required intramuscular medications just to keep him from being disruptive or aggressive with staff in the emergency room.  Orders placed for more as needed medication.  Patient will need inpatient psychiatric hospitalization.  No beds available  here.  Case reviewed with TTS.  Recommend referral out to other psychiatric facilities.   Bipolar affective disorder, mania severe without psychosis: -Started Zyprexa 10 mg BID -Admit to inpatient psychiatric care  Disposition: Recommend psychiatric Inpatient admission when medically cleared. Supportive therapy provided about ongoing stressors.  57, NP 04/12/2021 4:56 PM

## 2021-04-12 NOTE — ED Notes (Signed)
Breakfast tray set at bedside. 

## 2021-04-12 NOTE — BH Assessment (Signed)
TTS unable to wake patient to complete assessment. Will try at a later time.

## 2021-04-12 NOTE — BH Assessment (Signed)
Writer followed up with referrals:   [facility] [phone]), unable to reach anyone.   [facility] [phone]- [phone]), unable to reach anyone   [facility] ([phone]), Unable to reach anyone   [facility] [phone], [phone], [phone] or [phone]), unable to reach anyone   [facility] ([place]-[phone] or [phone]), No beds   [facility] (Tremine-[phone]), Denied   [facility] ([phone] -or- [phone]), [facility] (Rhonda-[phone] or [phone]), Patient pending review.   [facility] [phone]), Unable to reach anyone   [facility] [phone]). Unable to reach anyone

## 2021-04-12 NOTE — ED Notes (Signed)
Psych and TTS at bedside. 

## 2021-04-12 NOTE — ED Notes (Signed)
Patient changes positions on the stretcher, but is still sleeping.
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
## 2021-04-12 NOTE — ED Notes (Signed)
Pt given dinner tray. Pt eating at this time.

## 2021-04-13 MED ORDER — OLANZAPINE 5 MG PO TABS: 5.0000 mg | ORAL_TABLET | Freq: Two times a day (BID) | ORAL | 1 refills | Status: AC

## 2021-04-13 MED ORDER — OLANZAPINE 5 MG PO TABS: 5.0000 mg | ORAL_TABLET | Freq: Two times a day (BID) | ORAL | Status: DC

## 2021-04-13 NOTE — ED Notes (Signed)
Pt given meal tray. Woke up very pleasant.

## 2021-04-13 NOTE — ED Notes (Signed)
Psych and TTS at bedside. 

## 2021-04-13 NOTE — BH Assessment (Signed)
Writer received return phone call from patient's mother  Benna Dunks [phone]), writer updated her about patient's progress and the plan to discharge him.

## 2021-04-13 NOTE — ED Notes (Signed)
Pt given belongings bags back. Changed and has bookbag with him.

## 2021-04-13 NOTE — ED Provider Notes (Signed)
Emergency Medicine Observation Re-evaluation Note  Randy May is a 55 y.o. male, seen on rounds today.  Pt initially presented to the ED for complaints of Aggressive Behavior  Currently, the patient is calm, no acute complaints.  Physical Exam  Blood pressure 122/81, pulse (!) 53, temperature 98 F (36.7 C), temperature source Oral, resp. rate 18, height 6\' 1"  (1.854 m), weight 81.6 kg, SpO2 98 %. Physical Exam General: NAD Lungs: CTAB Psych: not agitated  ED Course / MDM  EKG:    I have reviewed the labs performed to date as well as medications administered while in observation.  Recent changes in the last 24 hours include no acute events overnight.  Patient feeling much better today, symptoms much improved.  Reassessed by psychiatry who finds him to be stabilized and has rescinded the IVC.  Plan  Current plan is for discharge. Patient is not under full IVC at this time.   , MD 04/13/21 [phone]

## 2021-04-13 NOTE — BH Assessment (Signed)
Writer called and left a HIPPA Compliant message with mother Benna Dunks [phone]), requesting a return phone call.

## 2021-04-13 NOTE — Consult Note (Signed)
[facility] Psych ED Discharge  04/13/2021 2:15 PM Randy May  MRN:  [id number] Principal Problem: Bipolar affective disorder, current episode manic without psychotic symptoms [facility]) Discharge Diagnoses: Principal Problem:   Bipolar affective disorder, current episode manic without psychotic symptoms (HCC) Active Problems:   Acute psychosis (HCC)  Subjective: "I slept good."  55 yo male admitted to the ED with mania with psychosis.  Medications started and he stabilized, especially after sleeping last night with Zyprexa.  He does not like Lithium as he complains it makes his chest hurt.  No suicidal/homicidal ideations, hallucinations, or substance abuse.  He is agreeable to continue the Zyprexa.  His mother was called with his permission and she does not have any safety concerns.  She stated he grabbed the wheel of the car as he told her she was going the wrong way after arguing with her prior to admission, not to harm her or himself.  Client is clear, coherent, calm, and at his baseline today.  Psychiatrically stable for discharge.  Total Time spent with patient: 30 minutes  Past Psychiatric History: bipolar d/o  Past Medical History: No past medical history on file. No past surgical history on file. Family History: No family history on file. Family Psychiatric  History: none Social History:  Social History   Substance and Sexual Activity  Alcohol Use Yes     Social History   Substance and Sexual Activity  Drug Use Never    Social History   Socioeconomic History  . Marital status: Married    Spouse name: Not on file  . Number of children: Not on file  . Years of education: Not on file  . Highest education level: Not on file  Occupational History  . Not on file  Tobacco Use  . Smoking status: Current Some Day Smoker  . Smokeless tobacco: Never Used  Substance and Sexual Activity  . Alcohol use: Yes  . Drug use: Never  . Sexual activity: Not on file  Other Topics Concern   . Not on file  Social History Narrative  . Not on file   Social Determinants of Health   Financial Resource Strain: Not on file  Food Insecurity: Not on file  Transportation Needs: Not on file  Physical Activity: Not on file  Stress: Not on file  Social Connections: Not on file    Has this patient used any form of tobacco in the last 30 days? (Cigarettes, Smokeless Tobacco, Cigars, and/or Pipes) A prescription for an FDA-approved tobacco cessation medication was offered at discharge and the patient refused  Current Medications: Current Facility-Administered Medications  Medication Dose Route Frequency Provider Last Rate Last Admin  . OLANZapine (ZYPREXA) tablet 5 mg  5 mg Oral BID Charm Rings, NP       No current outpatient medications on file.   PTA Medications: (Not in a hospital admission)   Musculoskeletal: Strength & Muscle Tone: within normal limits Gait & Station: normal Patient leans: N/A  Psychiatric Specialty Exam: Physical Exam Vitals and nursing note reviewed.  Constitutional:      Appearance: Normal appearance.  HENT:     Head: Normocephalic.     Nose: Nose normal.  Pulmonary:     Effort: Pulmonary effort is normal.  Musculoskeletal:        General: Normal range of motion.     Cervical back: Normal range of motion.  Neurological:     General: No focal deficit present.     Mental Status: He  is alert and oriented to person, place, and time.  Psychiatric:        Attention and Perception: Attention and perception normal.        Mood and Affect: Mood and affect normal.        Speech: Speech normal.        Behavior: Behavior normal. Behavior is cooperative.        Thought Content: Thought content normal.        Cognition and Memory: Cognition and memory normal.        Judgment: Judgment is impulsive.     Review of Systems  All other systems reviewed and are negative.   Blood pressure 122/81, pulse (!) 53, temperature 98 F (36.7 C),  temperature source Oral, resp. rate 18, height 6\' 1"  (1.854 m), weight 81.6 kg, SpO2 98 %.Body mass index is 23.75 kg/m.  General Appearance: Casual  Eye Contact:  Good  Speech:  Normal Rate  Volume:  Normal  Mood:  Euthymic  Affect:  Congruent  Thought Process:  Coherent and Descriptions of Associations: Intact  Orientation:  Full (Time, Place, and Person)  Thought Content:  WDL and Logical  Suicidal Thoughts:  No  Homicidal Thoughts:  No  Memory:  Immediate;   Good Recent;   Good Remote;   Good  Judgement:  Fair  Insight:  Fair  Psychomotor Activity:  Normal  Concentration:  Concentration: Good and Attention Span: Good  Recall:  Good  Fund of Knowledge:  Good  Language:  Good  Akathisia:  No  Handed:  Right  AIMS (if indicated):     Assets:  Housing Leisure Time Physical Health Resilience Social Support  ADL's:  Intact  Cognition:  WNL  Sleep:       Physical Exam: Physical Exam Vitals and nursing note reviewed.  Constitutional:      Appearance: Normal appearance.  HENT:     Head: Normocephalic.     Nose: Nose normal.  Pulmonary:     Effort: Pulmonary effort is normal.  Musculoskeletal:        General: Normal range of motion.     Cervical back: Normal range of motion.  Neurological:     General: No focal deficit present.     Mental Status: He is alert and oriented to person, place, and time.  Psychiatric:        Attention and Perception: Attention and perception normal.        Mood and Affect: Mood and affect normal.        Speech: Speech normal.        Behavior: Behavior normal. Behavior is cooperative.        Thought Content: Thought content normal.        Cognition and Memory: Cognition and memory normal.        Judgment: Judgment is impulsive.    Review of Systems  All other systems reviewed and are negative.  Blood pressure 122/81, pulse (!) 53, temperature 98 F (36.7 C), temperature source Oral, resp. rate 18, height 6\' 1"  (1.854 m), weight  81.6 kg, SpO2 98 %. Body mass index is 23.75 kg/m.   Demographic Factors:  Male and Caucasian  Loss Factors: NA  Historical Factors: Impulsivity  Risk Reduction Factors:   Sense of responsibility to family, Living with another person, especially a relative and Positive social support  Continued Clinical Symptoms:  None  Cognitive Features That Contribute To Risk:  None    Suicide Risk:  Minimal:  No identifiable suicidal ideation.  Patients presenting with no risk factors but with morbid ruminations; may be classified as minimal risk based on the severity of the depressive symptoms    Plan Of Care/Follow-up recommendations:  Bipolar disorder, most recent episode manic: -Continue Zyprexa 5 mg BID -Follow up outpatient Activity:  as tolerated Diet:  heart healthy diet  Disposition: discharge home Nanine Means, NP 04/13/2021, 2:15 PM

## 2021-04-13 NOTE — ED Notes (Signed)
Patient is resting comfortably. 

## 2021-04-23 ENCOUNTER — Other Ambulatory Visit: Payer: Self-pay

## 2021-04-23 ENCOUNTER — Emergency Department
Admission: EM | Admit: 2021-04-23 | Discharge: 2021-04-28 | Payer: No Typology Code available for payment source | Attending: Emergency Medicine | Admitting: Emergency Medicine

## 2021-04-23 DIAGNOSIS — F3112 Bipolar disorder, current episode manic without psychotic features, moderate: Secondary | ICD-10-CM | POA: Insufficient documentation

## 2021-04-23 DIAGNOSIS — F3110 Bipolar disorder, current episode manic without psychotic features, unspecified: Secondary | ICD-10-CM | POA: Diagnosis present

## 2021-04-23 DIAGNOSIS — Z20822 Contact with and (suspected) exposure to covid-19: Secondary | ICD-10-CM | POA: Insufficient documentation

## 2021-04-23 DIAGNOSIS — F17200 Nicotine dependence, unspecified, uncomplicated: Secondary | ICD-10-CM | POA: Insufficient documentation

## 2021-04-23 DIAGNOSIS — R45851 Suicidal ideations: Secondary | ICD-10-CM

## 2021-04-23 DIAGNOSIS — Z046 Encounter for general psychiatric examination, requested by authority: Secondary | ICD-10-CM | POA: Insufficient documentation

## 2021-04-23 LAB — COMPREHENSIVE METABOLIC PANEL
ALT: 14 U/L (ref 0–44)
AST: 19 U/L (ref 15–41)
Albumin: 4.3 g/dL (ref 3.5–5.0)
Alkaline Phosphatase: 65 U/L (ref 38–126)
Anion gap: 10 (ref 5–15)
BUN: 21 mg/dL — ABNORMAL HIGH (ref 6–20)
CO2: 25 mmol/L (ref 22–32)
Calcium: 9.6 mg/dL (ref 8.9–10.3)
Chloride: 104 mmol/L (ref 98–111)
Creatinine, Ser: 0.99 mg/dL (ref 0.61–1.24)
GFR, Estimated: >60 mL/min (ref >60)
Glucose, Bld: 128 mg/dL — ABNORMAL HIGH (ref 70–99)
Potassium: 3.8 mmol/L (ref 3.5–5.1)
Sodium: 139 mmol/L (ref 135–145)
Total Bilirubin: 0.4 mg/dL (ref 0.3–1.2)
Total Protein: 8 g/dL (ref 6.5–8.1)

## 2021-04-23 LAB — CBC
HCT: 40.5 % (ref 39.0–52.0)
Hemoglobin: 14 g/dL (ref 13.0–17.0)
MCH: 33.8 pg (ref 26.0–34.0)
MCHC: 34.6 g/dL (ref 30.0–36.0)
MCV: 97.8 fL (ref 80.0–100.0)
Platelets: 435 10*3/uL — ABNORMAL HIGH (ref 150–400)
RBC: 4.14 MIL/uL — ABNORMAL LOW (ref 4.22–5.81)
RDW: 13.4 % (ref 11.5–15.5)
WBC: 10.5 10*3/uL (ref 4.0–10.5)
nRBC: 0 % (ref 0.0–0.2)

## 2021-04-23 LAB — RESP PANEL BY RT-PCR (FLU A&B, COVID) ARPGX2
Influenza A by PCR: NEGATIVE
Influenza B by PCR: NEGATIVE
SARS Coronavirus 2 by RT PCR: NEGATIVE

## 2021-04-23 LAB — ETHANOL: Alcohol, Ethyl (B): <10 mg/dL (ref <10)

## 2021-04-23 MED ORDER — HALOPERIDOL LACTATE 5 MG/ML IJ SOLN: 5.0000 mg | Freq: Once | INTRAMUSCULAR | Status: AC

## 2021-04-23 MED ORDER — HALOPERIDOL LACTATE 5 MG/ML IJ SOLN
INTRAMUSCULAR | Status: AC
  Administered 2021-04-23: 5 mg via INTRAMUSCULAR
  Filled 2021-04-23: qty 1

## 2021-04-23 MED ORDER — LORAZEPAM 2 MG/ML IJ SOLN
INTRAMUSCULAR | Status: AC
  Administered 2021-04-23: 2 mg via INTRAMUSCULAR
  Filled 2021-04-23: qty 1

## 2021-04-23 MED ORDER — LORAZEPAM 2 MG/ML IJ SOLN: 2.0000 mg | Freq: Once | INTRAMUSCULAR | Status: AC

## 2021-04-23 NOTE — ED Provider Notes (Signed)
[facility] Emergency Department Provider Note  ____________________________________________   Event Date/Time   First MD Initiated Contact with Patient 04/23/21 2005     (approximate)  I have reviewed the triage vital signs and the nursing notes.   HISTORY  Chief Complaint IVC    HPI Randy May is a 55 y.o. male with bipolar who comes in under IVC.  Patient comes under IVC secondary to concern for SI, HI and manic behavior.  Patient originally called the police to help facilitate getting belongings out of his family's house but then the police got a call from then suicide hotline stating that he had called saying that he was having SI and HI and then would start laughing.  Patient is very manic and continues to talk very loudly and I am unable to get full HPI from patient due to his psychiatric illness       Medical: Bipolar, psychosis  Patient Active Problem List   Diagnosis Date Noted  . Bipolar affective disorder, current episode manic without psychotic symptoms (HCC) 04/12/2021  . Acute psychosis (HCC) 04/11/2021    No past surgical history on file.  Prior to Admission medications   Medication Sig Start Date End Date Taking? Authorizing Provider  OLANZapine (ZYPREXA) 5 MG tablet Take 1 tablet (5 mg total) by mouth 2 (two) times daily. 04/13/21   Charm Rings, NP    Allergies Patient has no known allergies.  No family history on file.  Social History Social History   Tobacco Use  . Smoking status: Current Some Day Smoker  . Smokeless tobacco: Never Used  Substance Use Topics  . Alcohol use: Yes  . Drug use: Never      Review of Systems Unable to get full review of systems due to psychiatric illness ____________________________________________   PHYSICAL EXAM:  VITAL SIGNS: ED Triage Vitals [04/23/21 2010]  Enc Vitals Group     BP (!) 140/97     Pulse Rate (!) 115     Resp 18     Temp 98.7 F (37.1 C)     Temp  Source Oral     SpO2 97 %     Weight      Height      Head Circumference      Peak Flow      Pain Score 0     Pain Loc      Pain Edu?      Excl. in GC?     Constitutional: Patient sitting up, he talking very loudly Eyes: Conjunctivae are normal. No swelling around eyes Head: Atraumatic. Nose: No congestion/rhinnorhea. Mouth/Throat: Mucous membranes are moist.   Neck: No stridor. Trachea Midline. FROM Cardiovascular: Tachycardic no swelling noted Respiratory: No increased wob, no stridor Gastrointestinal: Soft and nontender. No distention. No abdominal bruits.  Musculoskeletal: No lower extremity tenderness nor edema.  No joint effusions. Neurologic:  Normal speech and language. No gross focal neurologic deficits are appreciated.  Skin:  Skin is warm, dry and intact. No rash noted. Psychiatric: Pressured speech, manic, denies SI but was reportedly SI earlier GU: Deferred   ____________________________________________   LABS (all labs ordered are listed, but only abnormal results are displayed)  Labs Reviewed  COMPREHENSIVE METABOLIC PANEL - Abnormal; Notable for the following components:      Result Value   Glucose, Bld 128 (*)    BUN 21 (*)    All other components within normal limits  CBC - Abnormal; Notable  for the following components:   RBC 4.14 (*)    Platelets 435 (*)    All other components within normal limits  RESP PANEL BY RT-PCR (FLU A&B, COVID) ARPGX2  ETHANOL  URINE DRUG SCREEN, QUALITATIVE ([facility] ONLY)   __ INITIAL IMPRESSION / ASSESSMENT AND PLAN / ED COURSE  [name] was evaluated in Emergency Department on 04/23/2021 for the symptoms described in the history of present illness. He was evaluated in the context of the global COVID-19 pandemic, which necessitated consideration that the patient might be at risk for infection with the SARS-CoV-2 virus that causes COVID-19. Institutional protocols and algorithms that pertain to the evaluation of patients  at risk for COVID-19 are in a state of rapid change based on information released by regulatory bodies including the CDC and federal and state organizations. These policies and algorithms were followed during the patient's care in the ED.    Pt is without any acute medical complaints. No exam findings to suggest medical cause of current presentation. Will order psychiatric screening labs and discuss further w/ psychiatric service.  Patient is very manic and getting very agitated.  Patient given some Haldol and Ativan to help relax him.  Patient is under IVC by police  D/d includes but is not limited to psychiatric disease, behavioral/personality disorder, inadequate socioeconomic support, medical.  Based on HPI, exam, unremarkable labs, no concern for acute medical problem at this time. No rigidity, clonus, hyperthermia, focal neurologic deficit, diaphoresis, tachycardia, meningismus, ataxia, gait abnormality or other finding to suggest this visit represents a non-psychiatric problem. Screening labs reviewed.    Given this, pt medically cleared, to be dispositioned per Psych.    The patient has been placed in psychiatric observation due to the need to provide a safe environment for the patient while obtaining psychiatric consultation and evaluation, as well as ongoing medical and medication management to treat the patient's condition.  The patient has been placed under full IVC at this time.        ____________________________________________   FINAL CLINICAL IMPRESSION(S) / ED DIAGNOSES   Final diagnoses:  Suicide ideation      MEDICATIONS GIVEN DURING THIS VISIT:  Medications  haloperidol lactate (HALDOL) injection 5 mg (5 mg Intramuscular Given 04/23/21 2033)  LORazepam (ATIVAN) injection 2 mg (2 mg Intramuscular Given 04/23/21 2033)     ED Discharge Orders    None       Note:  This document was prepared using Dragon voice recognition software and may include unintentional  dictation errors.   Concha Se, MD 04/23/21 [phone]

## 2021-04-23 NOTE — ED Notes (Signed)
IM medications ordered due to patients erratic behavior, patient took medications as ordered willingly. Patient remains cooperative, but with pressured speech and restlessness.

## 2021-04-23 NOTE — ED Notes (Signed)
Gave patient water and [place] tray. AS

## 2021-04-23 NOTE — BH Assessment (Signed)
TTS and Psych NP, Annice Pih attempted to wake patient. Patient could not be aroused. Will try back at a later time.

## 2021-04-23 NOTE — ED Triage Notes (Signed)
Pt presents under IVC by [employer]. Per IVC papers patient called veteran hotline and reported that he was going to kill himself or his parents and then would say just kidding. When deputies responded to house patient with very erratic behavior. Upon arrival to ED patient maintains erratic behavior and states "I am not Malaki Kilman, I am a prisoner of war, I am from [place]". Pt with very pressured speech and flight of ideas. Unable to complete assessment due to flight of ideas.

## 2021-04-24 LAB — URINE DRUG SCREEN, QUALITATIVE (ARMC ONLY)
Amphetamines, Ur Screen: NOT DETECTED
Barbiturates, Ur Screen: NOT DETECTED
Benzodiazepine, Ur Scrn: NOT DETECTED
Cannabinoid 50 Ng, Ur ~~LOC~~: NOT DETECTED
Cocaine Metabolite,Ur ~~LOC~~: NOT DETECTED
MDMA (Ecstasy)Ur Screen: NOT DETECTED
Methadone Scn, Ur: NOT DETECTED
Opiate, Ur Screen: NOT DETECTED
Phencyclidine (PCP) Ur S: NOT DETECTED
Tricyclic, Ur Screen: NOT DETECTED

## 2021-04-24 MED ORDER — OLANZAPINE 5 MG PO TABS
5.0000 mg | ORAL_TABLET | Freq: Two times a day (BID) | ORAL | Status: DC
Start: 2021-04-24 — End: 2021-04-24
  Administered 2021-04-24: 5 mg via ORAL
  Filled 2021-04-24: qty 1

## 2021-04-24 MED ORDER — ZIPRASIDONE MESYLATE 20 MG IM SOLR: 20.0000 mg | Freq: Two times a day (BID) | INTRAMUSCULAR | Status: DC | PRN

## 2021-04-24 MED ORDER — LORAZEPAM 2 MG PO TABS: 2.0000 mg | ORAL_TABLET | ORAL | Status: DC | PRN

## 2021-04-24 MED ORDER — LORAZEPAM 2 MG/ML IJ SOLN: 2.0000 mg | INTRAMUSCULAR | Status: DC | PRN

## 2021-04-24 MED ORDER — QUETIAPINE FUMARATE 25 MG PO TABS
100.0000 mg | ORAL_TABLET | Freq: Two times a day (BID) | ORAL | Status: DC
  Administered 2021-04-24 – 2021-04-28 (×9): 100 mg via ORAL
  Filled 2021-04-24 (×9): qty 4

## 2021-04-24 NOTE — ED Notes (Signed)
TTS at bedside now.

## 2021-04-24 NOTE — ED Provider Notes (Signed)
Emergency Medicine Observation Re-evaluation Note  Randy May is a 55 y.o. male, seen on rounds today.  Pt initially presented to the ED for complaints of IVC Currently, the patient is resting comfortably.  Physical Exam  BP (!) 140/97   Pulse (!) 115   Temp 98.7 F (37.1 C) (Oral)   Resp 18   Ht 6\' 1"  (1.854 m)   Wt 81.6 kg   SpO2 97%   BMI 23.75 kg/m  Physical Exam Gen: No acute distress  Resp: Normal rise and fall of chest Neuro: Moving all four extremities Psych: Resting currently, calm and cooperative when awake    ED Course / MDM  EKG:   I have reviewed the labs performed to date as well as medications administered while in observation.  Recent changes in the last 24 hours include no acute events overnight.  Plan  Current plan is for psychiatric evaluation for further disposition. Patient is under full IVC at this time.   Demichael Traum, , DO 04/24/21 [phone]

## 2021-04-24 NOTE — ED Notes (Signed)
INVOLUNTARY with all papers on chart/awaiting TTS/PSYCH consult ?

## 2021-04-24 NOTE — ED Notes (Signed)
Pt provided with sandwich tray at this time.

## 2021-04-24 NOTE — ED Notes (Signed)
Pt sleeping. VS will be taken once awake. 

## 2021-04-24 NOTE — ED Notes (Signed)
Dr clapacs at bedside 

## 2021-04-24 NOTE — Consult Note (Signed)
[facility] Face-to-Face Psychiatry Consult   Reason for Consult: Consult for this 55 year old man brought in to the hospital with labile mood bizarre behavior manic symptoms. Referring Physician: Katrinka Blazing Patient Identification: Randy May MRN:  [id number] Principal Diagnosis: Bipolar affective disorder, current episode manic without psychotic symptoms (HCC) Diagnosis:  Principal Problem:   Bipolar affective disorder, current episode manic without psychotic symptoms (HCC)   Total Time spent with patient: 1 hour  Subjective:   Randy May is a 55 y.o. male patient admitted with "I got a job interview!".  HPI: Patient brought in under IVC papers.  Apparently yesterday made several calls to 911.  On 1 occasion asking them to send officers to help him move his belongings.  On another occasion reported to have said I am suicidal and homicidal while laughing at them.  Patient tells me that he sleeps hardly at all.  Will usually alternate days of sleeping.  Energy extremely high.  Thoughts racing.  Patient is restless has a hard time sitting still during the conversation.  Jumps from topic to topic.  Talks about several grandiose things including having job interviews that he needs to get to.  He is so pressured it is almost impossible to get a question in.  Patient says that the olanzapine that he was given last time actually made him more jittery and agitated.  He shows poor insight.  Patient denies that he has been using any drugs or drinking significantly and his drug screen is negative.  Past Psychiatric History: Past history is a little unclear although there are hints that he drops that he has been treated for and diagnosed with bipolar disorder in the past.  He is familiar with lithium when the topic is brought up.  He was just here in our emergency room about 10 days ago and was released after a couple days when he calm down a little from antipsychotics.  Unknown if he has had any suicidal behavior  in the past.  Risk to Self:   Risk to Others:   Prior Inpatient Therapy:   Prior Outpatient Therapy:    Past Medical History: No past medical history on file. No past surgical history on file. Family History: No family history on file. Family Psychiatric  History: None reported Social History:  Social History   Substance and Sexual Activity  Alcohol Use Yes     Social History   Substance and Sexual Activity  Drug Use Never    Social History   Socioeconomic History  . Marital status: Married    Spouse name: Not on file  . Number of children: Not on file  . Years of education: Not on file  . Highest education level: Not on file  Occupational History  . Not on file  Tobacco Use  . Smoking status: Current Some Day Smoker  . Smokeless tobacco: Never Used  Substance and Sexual Activity  . Alcohol use: Yes  . Drug use: Never  . Sexual activity: Not on file  Other Topics Concern  . Not on file  Social History Narrative  . Not on file   Social Determinants of Health   Financial Resource Strain: Not on file  Food Insecurity: Not on file  Transportation Needs: Not on file  Physical Activity: Not on file  Stress: Not on file  Social Connections: Not on file   Additional Social History:    Allergies:  No Known Allergies  Labs:  Results for orders placed or performed during  the hospital encounter of 04/23/21 (from the past 48 hour(s))  Urine Drug Screen, Qualitative     Status: None   Collection Time: 04/23/21  9:44 AM  Result Value Ref Range   Tricyclic, Ur Screen NONE DETECTED NONE DETECTED   Amphetamines, Ur Screen NONE DETECTED NONE DETECTED   MDMA (Ecstasy)Ur Screen NONE DETECTED NONE DETECTED   Cocaine Metabolite,Ur [place] NONE DETECTED NONE DETECTED   Opiate, Ur Screen NONE DETECTED NONE DETECTED   Phencyclidine (PCP) Ur S NONE DETECTED NONE DETECTED   Cannabinoid 50 Ng, Ur [place] NONE DETECTED NONE DETECTED   Barbiturates, Ur Screen NONE DETECTED NONE DETECTED    Benzodiazepine, Ur Scrn NONE DETECTED NONE DETECTED   Methadone Scn, Ur NONE DETECTED NONE DETECTED    Comment: (NOTE) Tricyclics + metabolites, urine    Cutoff 1000 ng/mL Amphetamines + metabolites, urine  Cutoff 1000 ng/mL MDMA (Ecstasy), urine              Cutoff 500 ng/mL Cocaine Metabolite, urine          Cutoff 300 ng/mL Opiate + metabolites, urine        Cutoff 300 ng/mL Phencyclidine (PCP), urine         Cutoff 25 ng/mL Cannabinoid, urine                 Cutoff 50 ng/mL Barbiturates + metabolites, urine  Cutoff 200 ng/mL Benzodiazepine, urine              Cutoff 200 ng/mL Methadone, urine                   Cutoff 300 ng/mL  The urine drug screen provides only a preliminary, unconfirmed analytical test result and should not be used for non-medical purposes. Clinical consideration and professional judgment should be applied to any positive drug screen result due to possible interfering substances. A more specific alternate chemical method must be used in order to obtain a confirmed analytical result. Gas chromatography / mass spectrometry (GC/MS) is the preferred confirm atory method. Performed at [facility], [address] [place], [place] [address]   Comprehensive metabolic panel     Status: Abnormal   Collection Time: 04/23/21  8:23 PM  Result Value Ref Range   Sodium 139 135 - 145 mmol/L   Potassium 3.8 3.5 - 5.1 mmol/L   Chloride 104 98 - 111 mmol/L   CO2 25 22 - 32 mmol/L   Glucose, Bld 128 (H) 70 - 99 mg/dL    Comment: Glucose reference range applies only to samples taken after fasting for at least 8 hours.   BUN 21 (H) 6 - 20 mg/dL   Creatinine, Ser 2.95 0.61 - 1.24 mg/dL   Calcium 9.6 8.9 - 62.1 mg/dL   Total Protein 8.0 6.5 - 8.1 g/dL   Albumin 4.3 3.5 - 5.0 g/dL   AST 19 15 - 41 U/L   ALT 14 0 - 44 U/L   Alkaline Phosphatase 65 38 - 126 U/L   Total Bilirubin 0.4 0.3 - 1.2 mg/dL   GFR, Estimated >30 >86 mL/min    Comment: (NOTE) Calculated  using the CKD-EPI Creatinine Equation (2021)    Anion gap 10 5 - 15    Comment: Performed at [facility], [address] [place], [place] [address]  Ethanol     Status: None   Collection Time: 04/23/21  8:23 PM  Result Value Ref Range   Alcohol, Ethyl (B) <10 <10 mg/dL  Comment: (NOTE) Lowest detectable limit for serum alcohol is 10 mg/dL.  For medical purposes only. Performed at [facility], [address] [place], [place] [address]   cbc     Status: Abnormal   Collection Time: 04/23/21  8:23 PM  Result Value Ref Range   WBC 10.5 4.0 - 10.5 K/uL   RBC 4.14 (L) 4.22 - 5.81 MIL/uL   Hemoglobin 14.0 13.0 - 17.0 g/dL   HCT 45.8 09.9 - 83.3 %   MCV 97.8 80.0 - 100.0 fL   MCH 33.8 26.0 - 34.0 pg   MCHC 34.6 30.0 - 36.0 g/dL   RDW 82.5 05.3 - 97.6 %   Platelets 435 (H) 150 - 400 K/uL   nRBC 0.0 0.0 - 0.2 %    Comment: Performed at [facility], [address], [place], [place] [address]  Resp Panel by RT-PCR (Flu A&B, Covid) Nasopharyngeal Swab     Status: None   Collection Time: 04/23/21  8:24 PM   Specimen: Nasopharyngeal Swab; Nasopharyngeal(NP) swabs in vial transport medium  Result Value Ref Range   SARS Coronavirus 2 by RT PCR NEGATIVE NEGATIVE    Comment: (NOTE) SARS-CoV-2 target nucleic acids are NOT DETECTED.  The SARS-CoV-2 RNA is generally detectable in upper respiratory specimens during the acute phase of infection. The lowest concentration of SARS-CoV-2 viral copies this assay can detect is 138 copies/mL. A negative result does not preclude SARS-Cov-2 infection and should not be used as the sole basis for treatment or other patient management decisions. A negative result may occur with  improper specimen collection/handling, submission of specimen other than nasopharyngeal swab, presence of viral mutation(s) within the areas targeted by this assay, and inadequate number of viral copies(<138 copies/mL). A negative result must be  combined with clinical observations, patient history, and epidemiological information. The expected result is Negative.  Fact Sheet for Patients:  BloggerCourse.com  Fact Sheet for Healthcare Providers:  SeriousBroker.it  This test is no t yet approved or cleared by the [place] FDA and  has been authorized for detection and/or diagnosis of SARS-CoV-2 by FDA under an Emergency Use Authorization (EUA). This EUA will remain  in effect (meaning this test can be used) for the duration of the COVID-19 declaration under Section 564(b)(1) of the Act, 21 U.S.C.section 360bbb-3(b)(1), unless the authorization is terminated  or revoked sooner.       Influenza A by PCR NEGATIVE NEGATIVE   Influenza B by PCR NEGATIVE NEGATIVE    Comment: (NOTE) The Xpert Xpress SARS-CoV-2/FLU/RSV plus assay is intended as an aid in the diagnosis of influenza from Nasopharyngeal swab specimens and should not be used as a sole basis for treatment. Nasal washings and aspirates are unacceptable for Xpert Xpress SARS-CoV-2/FLU/RSV testing.  Fact Sheet for Patients: BloggerCourse.com  Fact Sheet for Healthcare Providers: SeriousBroker.it  This test is not yet approved or cleared by the [place] FDA and has been authorized for detection and/or diagnosis of SARS-CoV-2 by FDA under an Emergency Use Authorization (EUA). This EUA will remain in effect (meaning this test can be used) for the duration of the COVID-19 declaration under Section 564(b)(1) of the Act, 21 U.S.C. section 360bbb-3(b)(1), unless the authorization is terminated or revoked.  Performed at [facility], [address] [place], [place] [address]     Current Facility-Administered Medications  Medication Dose Route Frequency Provider Last Rate Last Admin  . LORazepam (ATIVAN) tablet 2 mg  2 mg Oral Q4H PRN Necole Minassian  T,  MD       Or  . LORazepam (ATIVAN) injection 2 mg  2 mg Intramuscular Q4H PRN Janis Cuffe T, MD      . QUEtiapine (SEROQUEL) tablet 100 mg  100 mg Oral BID Jalin Erpelding T, MD      . ziprasidone (GEODON) injection 20 mg  20 mg Intramuscular Q12H PRN Kiauna Zywicki, Jackquline Denmark, MD       Current Outpatient Medications  Medication Sig Dispense Refill  . OLANZapine (ZYPREXA) 5 MG tablet Take 1 tablet (5 mg total) by mouth 2 (two) times daily. 60 tablet 1    Musculoskeletal: Strength & Muscle Tone: within normal limits Gait & Station: normal Patient leans: N/A            Psychiatric Specialty Exam:  Presentation  General Appearance: No data recorded Eye Contact:No data recorded Speech:No data recorded Speech Volume:No data recorded Handedness:No data recorded  Mood and Affect  Mood:No data recorded Affect:No data recorded  Thought Process  Thought Processes:No data recorded Descriptions of Associations:No data recorded Orientation:No data recorded Thought Content:No data recorded History of Schizophrenia/Schizoaffective disorder:No  Duration of Psychotic Symptoms:Less than six months  Hallucinations:No data recorded Ideas of Reference:No data recorded Suicidal Thoughts:No data recorded Homicidal Thoughts:No data recorded  Sensorium  Memory:No data recorded Judgment:No data recorded Insight:No data recorded  Executive Functions  Concentration:No data recorded Attention Span:No data recorded Recall:No data recorded Fund of Knowledge:No data recorded Language:No data recorded  Psychomotor Activity  Psychomotor Activity:No data recorded  Assets  Assets:No data recorded  Sleep  Sleep:No data recorded  Physical Exam: Physical Exam Vitals and nursing note reviewed.  Constitutional:      Appearance: Normal appearance.  HENT:     Head: Normocephalic and atraumatic.     Mouth/Throat:     Pharynx: Oropharynx is clear.  Eyes:     Pupils: Pupils are equal,  round, and reactive to light.  Cardiovascular:     Rate and Rhythm: Normal rate and regular rhythm.  Pulmonary:     Effort: Pulmonary effort is normal.     Breath sounds: Normal breath sounds.  Abdominal:     General: Abdomen is flat.     Palpations: Abdomen is soft.  Musculoskeletal:        General: Normal range of motion.  Skin:    General: Skin is warm and dry.  Neurological:     General: No focal deficit present.     Mental Status: He is alert. Mental status is at baseline.  Psychiatric:        Attention and Perception: He is inattentive.        Mood and Affect: Mood is elated. Affect is labile and inappropriate.        Speech: Speech is rapid and pressured and tangential.        Behavior: Behavior is agitated.        Thought Content: Thought content is paranoid. Thought content does not include homicidal or suicidal ideation.        Cognition and Memory: Memory is impaired.        Judgment: Judgment is impulsive and inappropriate.    Review of Systems  Constitutional: Negative.   HENT: Negative.   Eyes: Negative.   Respiratory: Negative.   Cardiovascular: Negative.   Gastrointestinal: Negative.   Musculoskeletal: Negative.   Skin: Negative.   Neurological: Negative.   Psychiatric/Behavioral: Negative for depression, hallucinations, substance abuse and suicidal ideas. The patient is nervous/anxious and has insomnia.  Blood pressure 108/65, pulse (!) 58, temperature 97.9 F (36.6 C), temperature source Oral, resp. rate 18, height 6\' 1"  (1.854 m), weight 81.6 kg, SpO2 98 %. Body mass index is 23.75 kg/m.  Treatment Plan Summary: Medication management and Plan 55 year old man disorganized with poor insight and unable to have a rational conversation.  Denies suicidal or homicidal ideation now but was making impulsive comments about yesterday.  Appears to have poor self-care and no engagement in outpatient treatment.  Appropriate thing appears to be to uphold the IVC.  I  have switched his medicine to Seroquel 100 mg twice a day and added as needed medication orders.  Case reviewed with emergency room and inpatient team and recommend inpatient hospitalization when a bed is available.  Ordered EKG because of antipsychotic medication dosing.  Disposition: Recommend psychiatric Inpatient admission when medically cleared. Supportive therapy provided about ongoing stressors. Discussed crisis plan, support from social network, calling 911, coming to the Emergency Department, and calling Suicide Hotline.  Mordecai RasmussenJohn Esther Bradstreet, MD 04/24/2021 1:56 PM

## 2021-04-24 NOTE — BH Assessment (Signed)
Comprehensive Clinical Assessment (CCA) Note  04/24/2021 LUCCIANO VITALI [id number]   Randy May, 55 year old male who presents to [facility] ED involuntarily for treatment. Per triage note, Pt presents under IVC by [facility]. Per IVC papers patient called veteran hotline and reported that he was going to kill himself or his parents and then would say just kidding. When deputies responded to house patient with very erratic behavior. Upon arrival to ED patient maintains erratic behavior and states "I am not Randy May, I am a prisoner of war, I am from [place]". Pt with very pressured speech and flight of ideas. Unable to complete assessment due to flight of ideas.    During TTS assessment pt presents alert and oriented x 4, restless but cooperative, and mood-congruent with affect. The pt does not appear to be responding to internal or external stimuli. Neither is the pt presenting with any delusional thinking. Pt verified the information provided to triage RN.   Pt identifies his main complaint to be that he got into an altercation with his parents and his mother called the police because she did not want him to come into their house. Patient reports he has been living with his parents for the past 3 months and was there to pick up a few items. Patient reports there is no reason he should be in the hospital. " My parents are crazy, not me." Patient states he was recently released from prison after spending 1 month in jail for a crime he says he did not commit. Patient was not able to recall the crime. Pt denies any illicit drug use and alcohol. Pt reports no current INPT or OPT hx. Patient states he is compliant with all medications and takes them as prescribed. Patient reports he began receiving psych meds while in prison. Pt denies SI/HI/AH/VH.    Per Dr. Toni Amend pt is recommended for inpatient psychiatric admission.    Chief Complaint:  Chief Complaint  Patient presents with  . IVC   Visit Diagnosis:  Bipolar affective disorder, current episode manic without psychotic symptoms   CCA Screening, Triage and Referral (STR)  Patient Reported Information How did you hear about [place]? -- Mudlogger)  Referral name: Parents  Referral phone number: No data recorded  Whom do you see for routine medical problems? No data recorded Practice/Facility Name: No data recorded Practice/Facility Phone Number: No data recorded Name of Contact: No data recorded Contact Number: No data recorded Contact Fax Number: No data recorded Prescriber Name: No data recorded Prescriber Address (if known): No data recorded  What Is the Reason for Your Visit/Call Today? Patient reports his mom called the police because she did not want him to come into their home.  How Long Has This Been Causing You Problems? 1 wk - 1 month  What Do You Feel Would Help You the Most Today? Treatment for Depression or other mood problem   Have You Recently Been in Any Inpatient Treatment (Hospital/Detox/Crisis Center/28-Day Program)? No  Name/Location of Program/Hospital:No data recorded How Long Were You There? No data recorded When Were You Discharged? No data recorded  Have You Ever Received Services From [facility] Before? Yes  Who Do You See at [facility]? Medical Treatment   Have You Recently Had Any Thoughts About Hurting Yourself? No  Are You Planning to Commit Suicide/Harm Yourself At This time? No   Have you Recently Had Thoughts About Hurting Someone Karolee Ohs? No  Explanation: No data recorded  Have You Used  Any Alcohol or Drugs in the Past 24 Hours? No  How Long Ago Did You Use Drugs or Alcohol? No data recorded What Did You Use and How Much? No data recorded  Do You Currently Have a Therapist/Psychiatrist? No  Name of Therapist/Psychiatrist: No data recorded  Have You Been Recently Discharged From Any Office Practice or Programs? No  Explanation of Discharge From Practice/Program: No data  recorded    CCA Screening Triage Referral Assessment Type of Contact: Face-to-Face  Is this Initial or Reassessment? No data recorded Date Telepsych consult ordered in [facility]:  No data recorded Time Telepsych consult ordered in [facility]:  No data recorded  Patient Reported Information Reviewed? Yes  Patient Left Without Being Seen? No data recorded Reason for Not Completing Assessment: No data recorded  Collateral Involvement: None provided   Does Patient Have a Court Appointed Legal Guardian? No data recorded Name and Contact of Legal Guardian: No data recorded If Minor and Not Living with Parent(s), Who has Custody? n/a  Is CPS involved or ever been involved? Never  Is APS involved or ever been involved? Never   Patient Determined To Be At Risk for Harm To Self or Others Based on Review of Patient Reported Information or Presenting Complaint? No  Method: No data recorded Availability of Means: No data recorded Intent: No data recorded Notification Required: No data recorded Additional Information for Danger to Others Potential: No data recorded Additional Comments for Danger to Others Potential: No data recorded Are There Guns or Other Weapons in Your Home? No data recorded Types of Guns/Weapons: No data recorded Are These Weapons Safely Secured?                            No data recorded Who Could Verify You Are Able To Have These Secured: No data recorded Do You Have any Outstanding Charges, Pending Court Dates, Parole/Probation? No data recorded Contacted To Inform of Risk of Harm To Self or Others: No data recorded  Location of Assessment: [facility] ED   Does Patient Present under Involuntary Commitment? Yes  IVC Papers Initial File Date: 04/23/2021   [place] of Residence: [place]   Patient Currently Receiving the Following Services: Not Receiving Services   Determination of Need: Urgent (48 hours)   Options For Referral: Inpatient Hospitalization     CCA  Biopsychosocial Intake/Chief Complaint:  Patient reports he got into a dispute with his parents and his mom called the police.  Current Symptoms/Problems: Patient is manic   Patient Reported Schizophrenia/Schizoaffective Diagnosis in Past: No   Strengths: Very vocal about what he want and need.  Preferences: Want to go home  Abilities: Very vocal   Type of Services Patient Feels are Needed: He states none   Initial Clinical Notes/Concerns: Reports of none   Mental Health Symptoms Depression:  None   Duration of Depressive symptoms: No data recorded  Mania:  Increased Energy; Racing thoughts; Recklessness   Anxiety:   Restlessness; Irritability; Difficulty concentrating   Psychosis:  Grossly disorganized speech   Duration of Psychotic symptoms: Less than six months   Trauma:  None   Obsessions:  None   Compulsions:  Poor Insight   Inattention:  None   Hyperactivity/Impulsivity:  Feeling of restlessness   Oppositional/Defiant Behaviors:  None   Emotional Irregularity:  N/A   Other Mood/Personality Symptoms:  No data recorded   Mental Status Exam Appearance and self-care  Stature:  Average   Weight:  Average weight   Clothing:  Neat/clean   Grooming:  Normal   Cosmetic use:  None   Posture/gait:  Normal   Motor activity:  Restless   Sensorium  Attention:  Distractible; Inattentive   Concentration:  Normal   Orientation:  X5   Recall/memory:  Normal   Affect and Mood  Affect:  Labile   Mood:  Anxious   Relating  Eye contact:  None   Facial expression:  Anxious   Attitude toward examiner:  Critical; Guarded; Hostile; Irritable   Thought and Language  Speech flow: Pressured; Flight of Ideas   Thought content:  Appropriate to Mood and Circumstances   Preoccupation:  Ruminations   Hallucinations:  None   Organization:  No data recorded  [employer] of Knowledge:  Average   Intelligence:  Average   Abstraction:   Abstract   Judgement:  Impaired   Reality Testing:  Distorted   Insight:  Lacking   Decision Making:  Impulsive   Social Functioning  Social Maturity:  Impulsive; Irresponsible   Social Judgement:  Impropriety   Stress  Stressors:  Family conflict; Housing   Coping Ability:  Exhausted   Skill Deficits:  Communication; Scientist, physiological; Self-control   Supports:  Friends/Service system; Family     Religion:    Leisure/Recreation:    Exercise/Diet: Exercise/Diet Do You Exercise?: No Have You Gained or Lost A Significant Amount of Weight in the Past Six Months?: No Do You Follow a Special Diet?: No Do You Have Any Trouble Sleeping?: Yes Explanation of Sleeping Difficulties: Patient reports he sleeps every other day.   CCA Employment/Education Employment/Work Situation: Employment / Work Psychologist, sport and exercise has patient been employed?: Unknown What is the longest time patient has a held a job?: Unknown Where was the patient employed at that time?: Unknown  Education: Education Is Patient Currently Attending School?: No   CCA Family/Childhood History Family and Relationship History:    Childhood History:  Childhood History By whom was/is the patient raised?: Both parents Additional childhood history information: None reported Description of patient's relationship with caregiver when they were a child: None reported Patient's description of current relationship with people who raised him/her: None reported How were you disciplined when you got in trouble as a child/adolescent?: None reported  Child/Adolescent Assessment:     CCA Substance Use Alcohol/Drug Use: Alcohol / Drug Use Pain Medications: See PTA Prescriptions: See PTA Over the Counter: See PTA                         ASAM's:  Six Dimensions of Multidimensional Assessment  Dimension 1:  Acute Intoxication and/or Withdrawal Potential:      Dimension 2:  Biomedical Conditions  and Complications:      Dimension 3:  Emotional, Behavioral, or Cognitive Conditions and Complications:     Dimension 4:  Readiness to Change:     Dimension 5:  Relapse, Continued use, or Continued Problem Potential:     Dimension 6:  Recovery/Living Environment:     ASAM Severity Score:    ASAM Recommended Level of Treatment:     Substance use Disorder (SUD)    Recommendations for Services/Supports/Treatments:    DSM5 Diagnoses: Patient Active Problem List   Diagnosis Date Noted  . Bipolar affective disorder, current episode manic without psychotic symptoms (HCC) 04/12/2021  . Acute psychosis (HCC) 04/11/2021    Patient Centered Plan: Patient is on the following Treatment Plan(s):  Referrals to Alternative Service(s): Referred to Alternative Service(s):   Place:   Date:   Time:    Referred to Alternative Service(s):   Place:   Date:   Time:    Referred to Alternative Service(s):   Place:   Date:   Time:    Referred to Alternative Service(s):   Place:   Date:   Time:     Rumaysa Sabatino Glennon Mac, Counselor, LCAS-A

## 2021-04-24 NOTE — ED Notes (Signed)
IVC inpatient admission recommended by Dr. Toni Amend

## 2021-04-24 NOTE — ED Notes (Signed)
IVC  CONSULT  DONE  PENDING  PLACEMENT 

## 2021-04-24 NOTE — ED Notes (Signed)
Pt wakes up and takes med. Having normal conversation about his favorite football team. Up to bathroom and gives urine sample. Denies further needs at this time.

## 2021-04-25 DIAGNOSIS — F3110 Bipolar disorder, current episode manic without psychotic features, unspecified: Secondary | ICD-10-CM

## 2021-04-25 NOTE — Progress Notes (Addendum)
CSW contacted multiple [facility] who report no psychiatric bed availability within the state. Veteran is under IVC and can not be transferred to [facility]. Accordingly, CSW will pursue community hospitals and continue to monitor bed availability at the [place] day-by-day.   [facility] notified [facility] via 72-hour notification line. Notification WAS submitted within 72 hours; confirmation # [id number] approval is under review. Per representative, anticipated processing time ranges from 3-4 days.   Patient was placed on waitlist with [facility]. POC: after hours AOD @ [phone] ext. [phone].   Signed:  Corky Crafts, MSW, [place], LCASA 04/25/2021 10:33 AM

## 2021-04-25 NOTE — ED Notes (Signed)
Report from jennifer, rn.  

## 2021-04-25 NOTE — Consult Note (Signed)
[facility] Face-to-Face Psychiatry Consult   Reason for Consult: Consult for this 55 year old man brought in to the hospital with labile mood bizarre behavior manic symptoms. Referring Physician: Katrinka Blazing Patient Identification: Randy May MRN:  [id number] Principal Diagnosis: Bipolar affective disorder, current episode manic without psychotic symptoms (HCC) Diagnosis:  Principal Problem:   Bipolar affective disorder, current episode manic without psychotic symptoms (HCC)  Total Time spent with patient: 30 minutes  Subjective:   Randy May is a 55 y.o. male patient stated "I'm feeling good, I got some sleep. I told them they need to wake me up for sex or food".  55 yo male presents with mania, history of bipolar d/o.  He stabilized last weekend and unfortunately stopped taking his medications as he claims they kept him awake and only slept every other day.  His mania returned.  He does feel the medication he was given yesterday, Geodon, helped him sleep and he feels better today.  He is calm and watching tv in her room, no outbursts.  He reports he wants to leave and go to work.  His mania has improved, not a baseline yet, continues to meet inpatient hospitalization.  HPI per Dr Toni Amend on 04/24/21: Patient brought in under IVC papers.  Apparently yesterday made several calls to 911.  On 1 occasion asking them to send officers to help him move his belongings.  On another occasion reported to have said I am suicidal and homicidal while laughing at them.  Patient tells me that he sleeps hardly at all.  Will usually alternate days of sleeping.  Energy extremely high.  Thoughts racing.  Patient is restless has a hard time sitting still during the conversation.  Jumps from topic to topic.  Talks about several grandiose things including having job interviews that he needs to get to.  He is so pressured it is almost impossible to get a question in.  Patient says that the olanzapine that he was given last time  actually made him more jittery and agitated.  He shows poor insight.  Patient denies that he has been using any drugs or drinking significantly and his drug screen is negative.  Past Psychiatric History: Past history is a little unclear although there are hints that he drops that he has been treated for and diagnosed with bipolar disorder in the past.  He is familiar with lithium when the topic is brought up.  He was just here in our emergency room about 10 days ago and was released after a couple days when he calm down a little from antipsychotics.  Unknown if he has had any suicidal behavior in the past.  Risk to Self:   Risk to Others:   Prior Inpatient Therapy:   Prior Outpatient Therapy:    Past Medical History: No past medical history on file. No past surgical history on file. Family History: No family history on file. Family Psychiatric  History: None reported Social History:  Social History   Substance and Sexual Activity  Alcohol Use Yes     Social History   Substance and Sexual Activity  Drug Use Never    Social History   Socioeconomic History  . Marital status: Married    Spouse name: Not on file  . Number of children: Not on file  . Years of education: Not on file  . Highest education level: Not on file  Occupational History  . Not on file  Tobacco Use  . Smoking status: Current Some Day  Smoker  . Smokeless tobacco: Never Used  Substance and Sexual Activity  . Alcohol use: Yes  . Drug use: Never  . Sexual activity: Not on file  Other Topics Concern  . Not on file  Social History Narrative  . Not on file   Social Determinants of Health   Financial Resource Strain: Not on file  Food Insecurity: Not on file  Transportation Needs: Not on file  Physical Activity: Not on file  Stress: Not on file  Social Connections: Not on file   Additional Social History:    Allergies:  No Known Allergies  Labs:  Results for orders placed or performed during the  hospital encounter of 04/23/21 (from the past 48 hour(s))  Comprehensive metabolic panel     Status: Abnormal   Collection Time: 04/23/21  8:23 PM  Result Value Ref Range   Sodium 139 135 - 145 mmol/L   Potassium 3.8 3.5 - 5.1 mmol/L   Chloride 104 98 - 111 mmol/L   CO2 25 22 - 32 mmol/L   Glucose, Bld 128 (H) 70 - 99 mg/dL    Comment: Glucose reference range applies only to samples taken after fasting for at least 8 hours.   BUN 21 (H) 6 - 20 mg/dL   Creatinine, Ser 9.73 0.61 - 1.24 mg/dL   Calcium 9.6 8.9 - 53.2 mg/dL   Total Protein 8.0 6.5 - 8.1 g/dL   Albumin 4.3 3.5 - 5.0 g/dL   AST 19 15 - 41 U/L   ALT 14 0 - 44 U/L   Alkaline Phosphatase 65 38 - 126 U/L   Total Bilirubin 0.4 0.3 - 1.2 mg/dL   GFR, Estimated >99 >24 mL/min    Comment: (NOTE) Calculated using the CKD-EPI Creatinine Equation (2021)    Anion gap 10 5 - 15    Comment: Performed at [facility], [address], [place], [place] [address]  Ethanol     Status: None   Collection Time: 04/23/21  8:23 PM  Result Value Ref Range   Alcohol, Ethyl (B) <10 <10 mg/dL    Comment: (NOTE) Lowest detectable limit for serum alcohol is 10 mg/dL.  For medical purposes only. Performed at [facility], [address] [place], [place] [address]   cbc     Status: Abnormal   Collection Time: 04/23/21  8:23 PM  Result Value Ref Range   WBC 10.5 4.0 - 10.5 K/uL   RBC 4.14 (L) 4.22 - 5.81 MIL/uL   Hemoglobin 14.0 13.0 - 17.0 g/dL   HCT 29.7 98.9 - 21.1 %   MCV 97.8 80.0 - 100.0 fL   MCH 33.8 26.0 - 34.0 pg   MCHC 34.6 30.0 - 36.0 g/dL   RDW 94.1 74.0 - 81.4 %   Platelets 435 (H) 150 - 400 K/uL   nRBC 0.0 0.0 - 0.2 %    Comment: Performed at [facility], [address], [place], [place] [address]  Resp Panel by RT-PCR (Flu A&B, Covid) Nasopharyngeal Swab     Status: None   Collection Time: 04/23/21  8:24 PM   Specimen: Nasopharyngeal Swab; Nasopharyngeal(NP) swabs in vial transport medium   Result Value Ref Range   SARS Coronavirus 2 by RT PCR NEGATIVE NEGATIVE    Comment: (NOTE) SARS-CoV-2 target nucleic acids are NOT DETECTED.  The SARS-CoV-2 RNA is generally detectable in upper respiratory specimens during the acute phase of infection. The lowest concentration of SARS-CoV-2 viral copies this assay can detect  is 138 copies/mL. A negative result does not preclude SARS-Cov-2 infection and should not be used as the sole basis for treatment or other patient management decisions. A negative result may occur with  improper specimen collection/handling, submission of specimen other than nasopharyngeal swab, presence of viral mutation(s) within the areas targeted by this assay, and inadequate number of viral copies(<138 copies/mL). A negative result must be combined with clinical observations, patient history, and epidemiological information. The expected result is Negative.  Fact Sheet for Patients:  BloggerCourse.comhttps://www.fda.gov/media/152166/download  Fact Sheet for Healthcare Providers:  SeriousBroker.ithttps://www.fda.gov/media/152162/download  This test is no t yet approved or cleared by the [place] FDA and  has been authorized for detection and/or diagnosis of SARS-CoV-2 by FDA under an Emergency Use Authorization (EUA). This EUA will remain  in effect (meaning this test can be used) for the duration of the COVID-19 declaration under Section 564(b)(1) of the Act, 21 U.S.C.section 360bbb-3(b)(1), unless the authorization is terminated  or revoked sooner.       Influenza A by PCR NEGATIVE NEGATIVE   Influenza B by PCR NEGATIVE NEGATIVE    Comment: (NOTE) The Xpert Xpress SARS-CoV-2/FLU/RSV plus assay is intended as an aid in the diagnosis of influenza from Nasopharyngeal swab specimens and should not be used as a sole basis for treatment. Nasal washings and aspirates are unacceptable for Xpert Xpress SARS-CoV-2/FLU/RSV testing.  Fact Sheet for  Patients: BloggerCourse.comhttps://www.fda.gov/media/152166/download  Fact Sheet for Healthcare Providers: SeriousBroker.ithttps://www.fda.gov/media/152162/download  This test is not yet approved or cleared by the [place] FDA and has been authorized for detection and/or diagnosis of SARS-CoV-2 by FDA under an Emergency Use Authorization (EUA). This EUA will remain in effect (meaning this test can be used) for the duration of the COVID-19 declaration under Section 564(b)(1) of the Act, 21 U.S.C. section 360bbb-3(b)(1), unless the authorization is terminated or revoked.  Performed at [facility], [address] [place], [place] [address]     Current Facility-Administered Medications  Medication Dose Route Frequency Provider Last Rate Last Admin  . LORazepam (ATIVAN) tablet 2 mg  2 mg Oral Q4H PRN Clapacs, Jackquline DenmarkJohn T, MD       Or  . LORazepam (ATIVAN) injection 2 mg  2 mg Intramuscular Q4H PRN Clapacs, John T, MD      . QUEtiapine (SEROQUEL) tablet 100 mg  100 mg Oral BID Clapacs, John T, MD   100 mg at 04/25/21 1023  . ziprasidone (GEODON) injection 20 mg  20 mg Intramuscular Q12H PRN Clapacs, Jackquline DenmarkJohn T, MD       Current Outpatient Medications  Medication Sig Dispense Refill  . OLANZapine (ZYPREXA) 5 MG tablet Take 1 tablet (5 mg total) by mouth 2 (two) times daily. 60 tablet 1    Musculoskeletal: Strength & Muscle Tone: within normal limits Gait & Station: normal Patient leans: N/A  Psychiatric Specialty Exam: Physical Exam Vitals and nursing note reviewed.  Constitutional:      Appearance: Normal appearance.  HENT:     Head: Normocephalic and atraumatic.     Mouth/Throat:     Pharynx: Oropharynx is clear.  Eyes:     Pupils: Pupils are equal, round, and reactive to light.  Cardiovascular:     Rate and Rhythm: Normal rate and regular rhythm.  Pulmonary:     Effort: Pulmonary effort is normal.     Breath sounds: Normal breath sounds.  Abdominal:     General: Abdomen is flat.      Palpations: Abdomen is soft.  Musculoskeletal:  General: Normal range of motion.  Skin:    General: Skin is warm and dry.  Neurological:     General: No focal deficit present.     Mental Status: He is alert. Mental status is at baseline.  Psychiatric:        Attention and Perception: He is inattentive.        Mood and Affect: Mood is elated. Affect is labile.        Speech: Speech is tangential.        Behavior: Behavior is cooperative.        Thought Content: Thought content normal. Thought content does not include homicidal or suicidal ideation.        Cognition and Memory: Memory is impaired.        Judgment: Judgment is impulsive.     Review of Systems  Constitutional: Negative.   HENT: Negative.   Eyes: Negative.   Respiratory: Negative.   Cardiovascular: Negative.   Gastrointestinal: Negative.   Musculoskeletal: Negative.   Skin: Negative.   Neurological: Negative.   Psychiatric/Behavioral: Negative for depression, hallucinations, substance abuse and suicidal ideas. The patient has insomnia.     Blood pressure 120/82, pulse 68, temperature 97.6 F (36.4 C), temperature source Oral, resp. rate 18, height 6\' 1"  (1.854 m), weight 81.6 kg, SpO2 98 %.Body mass index is 23.75 kg/m.  General Appearance: Casual  Eye Contact:  Fair  Speech:  Normal Rate  Volume:  Normal  Mood:  Euphoric  Affect:  Congruent  Thought Process:  Coherent and Descriptions of Associations: Intact  Orientation:  Full (Time, Place, and Person)  Thought Content:  Logical  Suicidal Thoughts:  No  Homicidal Thoughts:  No  Memory:  Immediate;   Fair Recent;   Fair Remote;   Fair  Judgement:  Poor  Insight:  Lacking  Psychomotor Activity:  Normal  Concentration:  Concentration: Fair and Attention Span: Fair  Recall:  of Knowledge:  Fair  Language:  Good  Akathisia:  No  Handed:  Right  AIMS (if indicated):     Assets:  Housing Leisure Time Physical  Health Resilience Social Support  ADL's:  Intact  Cognition:  WNL  Sleep:      Physical Exam: Physical Exam Vitals and nursing note reviewed.  Constitutional:      Appearance: Normal appearance.  HENT:     Head: Normocephalic and atraumatic.     Mouth/Throat:     Pharynx: Oropharynx is clear.  Eyes:     Pupils: Pupils are equal, round, and reactive to light.  Cardiovascular:     Rate and Rhythm: Normal rate and regular rhythm.  Pulmonary:     Effort: Pulmonary effort is normal.     Breath sounds: Normal breath sounds.  Abdominal:     General: Abdomen is flat.     Palpations: Abdomen is soft.  Musculoskeletal:        General: Normal range of motion.  Skin:    General: Skin is warm and dry.  Neurological:     General: No focal deficit present.     Mental Status: He is alert. Mental status is at baseline.  Psychiatric:        Attention and Perception: He is inattentive.        Mood and Affect: Mood is elated. Affect is labile.        Speech: Speech is tangential.        Behavior: Behavior is cooperative.  Thought Content: Thought content normal. Thought content does not include homicidal or suicidal ideation.        Cognition and Memory: Memory is impaired.        Judgment: Judgment is impulsive.    Review of Systems  Constitutional: Negative.   HENT: Negative.   Eyes: Negative.   Respiratory: Negative.   Cardiovascular: Negative.   Gastrointestinal: Negative.   Musculoskeletal: Negative.   Skin: Negative.   Neurological: Negative.   Psychiatric/Behavioral: Negative for depression, hallucinations, substance abuse and suicidal ideas. The patient has insomnia.    Blood pressure 120/82, pulse 68, temperature 97.6 F (36.4 C), temperature source Oral, resp. rate 18, height 6\' 1"  (1.854 m), weight 81.6 kg, SpO2 98 %. Body mass index is 23.75 kg/m.  Treatment Plan Summary: Medication management and Plan 55 year old man disorganized with poor insight and unable  to have a rational conversation.  Denies suicidal or homicidal ideation now but was making impulsive comments about yesterday.  Appears to have poor self-care and no engagement in outpatient treatment.  Appropriate thing appears to be to uphold the IVC.  I have switched his medicine to Seroquel 100 mg twice a day and added as needed medication orders.  Case reviewed with emergency room and inpatient team and recommend inpatient hospitalization when a bed is available.  Ordered EKG because of antipsychotic medication dosing.   Bipolar affective disorder: -Continue Seroquel 100 mg BID -Inpatient admission warranted  Agitation: -Continue Geodon 20 mg IM BID PRN -Continue Ativan 2 mg every fours hours PRN  Disposition: Recommend psychiatric Inpatient admission when medically cleared. Supportive therapy provided about ongoing stressors. Discussed crisis plan, support from social network, calling 911, coming to the Emergency Department, and calling Suicide Hotline.  57, NP 04/25/2021 1:41 PM

## 2021-04-25 NOTE — ED Notes (Signed)
IVC/Pending Placement 

## 2021-04-26 DIAGNOSIS — F3110 Bipolar disorder, current episode manic without psychotic features, unspecified: Secondary | ICD-10-CM | POA: Diagnosis not present

## 2021-04-26 NOTE — ED Notes (Signed)
Report to include Situation, Background, Assessment, and Recommendations received from Sarah RN. Patient alert and oriented, warm and dry, in no acute distress. Patient denies SI, HI, AVH and pain. Patient made aware of Q15 minute rounds and security cameras for their safety. Patient instructed to come to me with needs or concerns.  

## 2021-04-26 NOTE — ED Notes (Signed)
Pt taking shower, given change of scrubs and toothbrush/toothpaste.

## 2021-04-26 NOTE — ED Notes (Signed)
Report to josh, rn.  

## 2021-04-26 NOTE — ED Notes (Signed)
Hourly rounding reveals patient in room. No complaints, stable, in no acute distress. Q15 minute rounds and monitoring via Security Cameras to continue. 

## 2021-04-26 NOTE — ED Notes (Signed)
PT given lunch tray. No other needs at this time.
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
## 2021-04-26 NOTE — ED Notes (Signed)
Pt given dinner tray.

## 2021-04-27 DIAGNOSIS — F3110 Bipolar disorder, current episode manic without psychotic features, unspecified: Secondary | ICD-10-CM | POA: Diagnosis not present

## 2021-04-27 NOTE — ED Notes (Signed)
Hourly rounding reveals patient in room. No complaints, stable, in no acute distress. Q15 minute rounds and monitoring via Security Cameras to continue. 

## 2021-04-27 NOTE — BH Assessment (Addendum)
PATIENT IS SCHEDULED FOR ADMISSION ANYTIME AFTER 8AM  Patient has been accepted to [facility]  Patient assigned to: Main campus Accepting physician is Dr. Loyola Mast  Call report to [phone] Representative was Fayrene Fearing   ER Staff is aware of it:  Christen Bame, Pensions consultant. Katrinka Blazing, ER MD  Annette Stable, Patient's Nurse  TTS awaiting consent from pt to contact supports   TTS faxed IVC as requested

## 2021-04-27 NOTE — ED Notes (Signed)
Breakfast tray given. Shower offered. Shower supplies given. Pt is currently taking a shower. No other needs found at this moment.

## 2021-04-27 NOTE — ED Notes (Signed)
Pt received dinner tray. Pt on the phone. Pt calm and mannerly. Pt eager to eat as he said he was hungry.

## 2021-04-27 NOTE — ED Notes (Signed)
Report to include Situation, Background, Assessment, and Recommendations received from William RN. Patient alert and oriented, warm and dry, in no acute distress. Patient denies SI, HI, AVH and pain. Patient made aware of Q15 minute rounds and security cameras for their safety. Patient instructed to come to me with needs or concerns.  

## 2021-04-27 NOTE — ED Notes (Signed)
Pt given phone 

## 2021-04-27 NOTE — ED Provider Notes (Signed)
Emergency Medicine Observation Re-evaluation Note  Randy May is a 55 y.o. male, seen on rounds today.  Pt initially presented to the ED for complaints of IVC Currently, the patient is resting, no acute complaints.  Physical Exam  BP 133/83 (BP Location: Right Arm)   Pulse (!) 56   Temp 98.2 F (36.8 C)   Resp 16   Ht 6\' 1"  (1.854 m)   Wt 81.6 kg   SpO2 98%   BMI 23.75 kg/m  Physical Exam General: no acute distress Cardiac: normal rate Lungs: equal chest rise Psych: calm   ED Course / MDM  EKG:EKG Interpretation  Date/Time:  Thursday April 24 2021 15:11:36 EDT Ventricular Rate:  58 PR Interval:  176 QRS Duration: 98 QT Interval:  432 QTC Calculation: 424 R Axis:   91 Text Interpretation: Sinus bradycardia Rightward axis Possible Inferior infarct , age undetermined Cannot rule out Anterior infarct , age undetermined Abnormal ECG Confirmed by UNCONFIRMED, DOCTOR (06-15-2000), editor [id number], Tammy [phone]) on 04/25/2021 8:54:33 AM   I have reviewed the labs performed to date as well as medications administered while in observation.  Recent changes in the last 24 hours include none.  Plan  Current plan is for inpatient. Patient is not under full IVC at this time.   06/25/2021, MD 04/27/21 [phone]

## 2021-04-27 NOTE — ED Notes (Signed)
Snack and beverage given. 

## 2021-04-27 NOTE — BH Assessment (Addendum)
TTS completed a referral check with [facility] and was told no beds are available at this time and confirmed pt to remain on the waitlist.   [facility]- Per Randy May, pt is under review for possible admission tomorrow, no beds today   TTS referred pt out to additional facilities  Referral information for Psychiatric Hospitalization faxed to:  Randy May [phone]- [phone])  [address] [phone])   Randy May [phone] -or- [phone])   [facility] [phone] -[phone]) [phone]  Randy May [phone])   [place] [phone] or [phone])  Randy May [phone] or [phone])  Randy May [phone])

## 2021-04-27 NOTE — ED Notes (Signed)
Hourly rounding reveals patient in room. No complaints, stable, in no acute distress. Q15 minute rounds and monitoring via Rover and Officer to continue.   

## 2021-04-27 NOTE — ED Notes (Signed)
VS not taken, patient asleep 

## 2021-04-27 NOTE — Consult Note (Signed)
[facility] Face-to-Face Psychiatry Consult   Reason for Consult: Consult for this 55 year old man brought in to the hospital with labile mood bizarre behavior manic symptoms. Referring Physician: Katrinka Blazing Patient Identification: EESA JUSTISS MRN:  [id number] Principal Diagnosis: Bipolar affective disorder, current episode manic without psychotic symptoms (HCC) Diagnosis:  Principal Problem:   Bipolar affective disorder, current episode manic without psychotic symptoms (HCC)  Total Time spent with patient: 30 minutes  Subjective:   NEHAN FLAUM is a 55 y.o. male patient stated "Everything is good."  55 yo male presents with mania, history of bipolar d/o.  His symptoms are improving but continues to state that, "I've been stable, there's nothing wrong with me.  I don't need to be here.  I had a court ordered psychiatric evaluation in January at [facility] and cleared to stand in court.  I have an IQ of 140.  My mother is crazy, not me."  No insight.  He is cooperative.  Not at baseline yet, continues to meet inpatient hospitalization, accepted to [facility] after 8 am on Monday.  This information was explained to him and he does not feel he needs to go.  Explained that he was here last week and returned later in the week based on instability, he continues to state he is "stable".  HPI per Dr Toni Amend on 04/24/21: Patient brought in under IVC papers.  Apparently yesterday made several calls to 911.  On 1 occasion asking them to send officers to help him move his belongings.  On another occasion reported to have said I am suicidal and homicidal while laughing at them.  Patient tells me that he sleeps hardly at all.  Will usually alternate days of sleeping.  Energy extremely high.  Thoughts racing.  Patient is restless has a hard time sitting still during the conversation.  Jumps from topic to topic.  Talks about several grandiose things including having job interviews that he needs to get to.  He is so pressured it is  almost impossible to get a question in.  Patient says that the olanzapine that he was given last time actually made him more jittery and agitated.  He shows poor insight.  Patient denies that he has been using any drugs or drinking significantly and his drug screen is negative.  Past Psychiatric History: Past history is a little unclear although there are hints that he drops that he has been treated for and diagnosed with bipolar disorder in the past.  He is familiar with lithium when the topic is brought up.  He was just here in our emergency room about 10 days ago and was released after a couple days when he calm down a little from antipsychotics.  Unknown if he has had any suicidal behavior in the past.  Risk to Self:   Risk to Others:   Prior Inpatient Therapy:   Prior Outpatient Therapy:    Past Medical History: No past medical history on file. No past surgical history on file. Family History: No family history on file. Family Psychiatric  History: None reported Social History:  Social History   Substance and Sexual Activity  Alcohol Use Yes     Social History   Substance and Sexual Activity  Drug Use Never    Social History   Socioeconomic History  . Marital status: Married    Spouse name: Not on file  . Number of children: Not on file  . Years of education: Not on file  . Highest education  level: Not on file  Occupational History  . Not on file  Tobacco Use  . Smoking status: Current Some Day Smoker  . Smokeless tobacco: Never Used  Substance and Sexual Activity  . Alcohol use: Yes  . Drug use: Never  . Sexual activity: Not on file  Other Topics Concern  . Not on file  Social History Narrative  . Not on file   Social Determinants of Health   Financial Resource Strain: Not on file  Food Insecurity: Not on file  Transportation Needs: Not on file  Physical Activity: Not on file  Stress: Not on file  Social Connections: Not on file   Additional Social  History:    Allergies:  No Known Allergies  Labs:  No results found for this or any previous visit (from the past 48 hour(s)).  Current Facility-Administered Medications  Medication Dose Route Frequency Provider Last Rate Last Admin  . LORazepam (ATIVAN) tablet 2 mg  2 mg Oral Q4H PRN Clapacs, Jackquline DenmarkJohn T, MD       Or  . LORazepam (ATIVAN) injection 2 mg  2 mg Intramuscular Q4H PRN Clapacs, John T, MD      . QUEtiapine (SEROQUEL) tablet 100 mg  100 mg Oral BID Clapacs, Jackquline DenmarkJohn T, MD   100 mg at 04/26/21 2133  . ziprasidone (GEODON) injection 20 mg  20 mg Intramuscular Q12H PRN Clapacs, Jackquline DenmarkJohn T, MD       Current Outpatient Medications  Medication Sig Dispense Refill  . OLANZapine (ZYPREXA) 5 MG tablet Take 1 tablet (5 mg total) by mouth 2 (two) times daily. 60 tablet 1    Musculoskeletal: Strength & Muscle Tone: within normal limits Gait & Station: normal Patient leans: N/A  Psychiatric Specialty Exam: Physical Exam Vitals and nursing note reviewed.  Constitutional:      Appearance: Normal appearance.  HENT:     Head: Normocephalic and atraumatic.     Mouth/Throat:     Pharynx: Oropharynx is clear.  Eyes:     Pupils: Pupils are equal, round, and reactive to light.  Cardiovascular:     Rate and Rhythm: Normal rate and regular rhythm.  Pulmonary:     Effort: Pulmonary effort is normal.     Breath sounds: Normal breath sounds.  Abdominal:     General: Abdomen is flat.     Palpations: Abdomen is soft.  Musculoskeletal:        General: Normal range of motion.  Skin:    General: Skin is warm and dry.  Neurological:     General: No focal deficit present.     Mental Status: He is alert. Mental status is at baseline.  Psychiatric:        Attention and Perception: He is inattentive.        Mood and Affect: Affect is labile.        Speech: Speech normal.        Behavior: Behavior is cooperative.        Thought Content: Thought content normal. Thought content does not include  homicidal or suicidal ideation.        Cognition and Memory: Memory is impaired.        Judgment: Judgment is impulsive.     Review of Systems  Constitutional: Negative.   HENT: Negative.   Eyes: Negative.   Respiratory: Negative.   Cardiovascular: Negative.   Gastrointestinal: Negative.   Musculoskeletal: Negative.   Skin: Negative.   Neurological: Negative.   Psychiatric/Behavioral: Negative for depression,  hallucinations, substance abuse and suicidal ideas. The patient is nervous/anxious and has insomnia.     Blood pressure 131/89, pulse 65, temperature (!) 97.5 F (36.4 C), temperature source Oral, resp. rate 16, height 6\' 1"  (1.854 m), weight 81.6 kg, SpO2 98 %.Body mass index is 23.75 kg/m.  General Appearance: Casual  Eye Contact:  Fair  Speech:  Normal Rate  Volume:  Normal  Mood:  Euphoric  Affect:  Congruent  Thought Process:  Coherent and Descriptions of Associations: Intact  Orientation:  Full (Time, Place, and Person)  Thought Content:  Logical  Suicidal Thoughts:  No  Homicidal Thoughts:  No  Memory:  Immediate;   Fair Recent;   Fair Remote;   Fair  Judgement:  Poor  Insight:  Lacking  Psychomotor Activity:  Normal  Concentration:  Concentration: Fair and Attention Span: Fair  Recall:  of Knowledge:  Fair  Language:  Good  Akathisia:  No  Handed:  Right  AIMS (if indicated):     Assets:  Housing Leisure Time Physical Health Resilience Social Support  ADL's:  Intact  Cognition:  WNL  Sleep:      Physical Exam: Physical Exam Vitals and nursing note reviewed.  Constitutional:      Appearance: Normal appearance.  HENT:     Head: Normocephalic and atraumatic.     Mouth/Throat:     Pharynx: Oropharynx is clear.  Eyes:     Pupils: Pupils are equal, round, and reactive to light.  Cardiovascular:     Rate and Rhythm: Normal rate and regular rhythm.  Pulmonary:     Effort: Pulmonary effort is normal.     Breath sounds: Normal breath  sounds.  Abdominal:     General: Abdomen is flat.     Palpations: Abdomen is soft.  Musculoskeletal:        General: Normal range of motion.  Skin:    General: Skin is warm and dry.  Neurological:     General: No focal deficit present.     Mental Status: He is alert. Mental status is at baseline.  Psychiatric:        Attention and Perception: He is inattentive.        Mood and Affect: Affect is labile.        Speech: Speech normal.        Behavior: Behavior is cooperative.        Thought Content: Thought content normal. Thought content does not include homicidal or suicidal ideation.        Cognition and Memory: Memory is impaired.        Judgment: Judgment is impulsive.    Review of Systems  Constitutional: Negative.   HENT: Negative.   Eyes: Negative.   Respiratory: Negative.   Cardiovascular: Negative.   Gastrointestinal: Negative.   Musculoskeletal: Negative.   Skin: Negative.   Neurological: Negative.   Psychiatric/Behavioral: Negative for depression, hallucinations, substance abuse and suicidal ideas. The patient is nervous/anxious and has insomnia.    Blood pressure 131/89, pulse 65, temperature (!) 97.5 F (36.4 C), temperature source Oral, resp. rate 16, height 6\' 1"  (1.854 m), weight 81.6 kg, SpO2 98 %. Body mass index is 23.75 kg/m.  Treatment Plan Summary: Medication management and Plan 55 year old man disorganized with poor insight and unable to have a rational conversation.  Denies suicidal or homicidal ideation now but was making impulsive comments about yesterday.  Appears to have poor self-care and no engagement in outpatient treatment.  Appropriate thing appears to be to uphold the IVC.  I have switched his medicine to Seroquel 100 mg twice a day and added as needed medication orders.  Case reviewed with emergency room and inpatient team and recommend inpatient hospitalization when a bed is available.  Ordered EKG because of antipsychotic medication dosing.    Bipolar affective disorder: -Continue Seroquel 100 mg BID -Inpatient admission warranted, [facility] after 8 am  Agitation: -Continue Geodon 20 mg IM BID PRN -Continue Ativan 2 mg every fours hours PRN  Disposition: Recommend psychiatric Inpatient admission when medically cleared. Supportive therapy provided about ongoing stressors. Discussed crisis plan, support from social network, calling 911, coming to the Emergency Department, and calling Suicide Hotline.  Nanine Means, NP 04/27/2021 9:37 AM

## 2021-04-28 NOTE — ED Notes (Signed)
Hourly rounding reveals patient in room. No complaints, stable, in no acute distress. Q15 minute rounds and monitoring via Security Cameras to continue. 

## 2021-04-28 NOTE — ED Notes (Signed)
[facility]  [facility]  SHERIFF  DEPT  CALLED  FOR  TRANSPORT  TO  [facility]  INFORMED  JENNIFER  RN AND  DR  Larinda Buttery  MD

## 2021-04-28 NOTE — ED Notes (Signed)
VS not taken, patient asleep 

## 2021-04-28 NOTE — ED Provider Notes (Signed)
Emergency Medicine Observation Re-evaluation Note  Randy May is a 55 y.o. male, seen on rounds today.  Pt initially presented to the ED for complaints of IVC Currently, the patient is standing in his room, requesting discharge home.  Physical Exam  BP (!) 133/92   Pulse 68   Temp 98.4 F (36.9 C)   Resp 18   Ht 6\' 1"  (1.854 m)   Wt 81.6 kg   SpO2 97%   BMI 23.75 kg/m  Physical Exam Constitutional: Resting comfortably. Eyes: Conjunctivae are normal. Head: Atraumatic. Nose: No congestion/rhinnorhea. Mouth/Throat: Mucous membranes are moist. Neck: Normal ROM Cardiovascular: No cyanosis noted. Respiratory: Normal respiratory effort. Gastrointestinal: Non-distended. Genitourinary: deferred Musculoskeletal: No lower extremity tenderness nor edema. Neurologic:  Normal speech and language. No gross focal neurologic deficits are appreciated. Skin:  Skin is warm, dry and intact. No rash noted.    ED Course / MDM  EKG:EKG Interpretation  Date/Time:  Thursday April 24 2021 15:11:36 EDT Ventricular Rate:  58 PR Interval:  176 QRS Duration: 98 QT Interval:  432 QTC Calculation: 424 R Axis:   91 Text Interpretation: Sinus bradycardia Rightward axis Possible Inferior infarct , age undetermined Cannot rule out Anterior infarct , age undetermined Abnormal ECG Confirmed by UNCONFIRMED, DOCTOR (06-15-2000), editor [id number], Tammy [phone]) on 04/25/2021 8:54:33 AM   I have reviewed the labs performed to date as well as medications administered while in observation.  Recent changes in the last 24 hours include none.  Plan  Current plan is for transfer for psychiatric admission this morning. Patient is under full IVC at this time.   06/25/2021, MD 04/28/21 [phone]
